# Patient Record
Sex: Female | Born: 1985 | Race: Black or African American | Hispanic: No | Marital: Married | Smoking: Never smoker
Health system: Southern US, Community
[De-identification: ages and names within clinical notes are randomized; demographics above are authoritative.]

---
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
## 2011-06-02 ENCOUNTER — Emergency Department: Payer: Self-pay | Admitting: Emergency Medicine

## 2011-06-02 LAB — PREGNANCY, URINE: Pregnancy Test, Urine: NEGATIVE m[IU]/mL

## 2011-06-03 ENCOUNTER — Emergency Department: Payer: Self-pay | Admitting: Internal Medicine

## 2015-04-03 ENCOUNTER — Encounter: Payer: Self-pay | Admitting: Physician Assistant

## 2015-04-03 ENCOUNTER — Ambulatory Visit: Payer: Self-pay | Admitting: Physician Assistant

## 2015-04-03 VITALS — BP 140/98 | HR 72 | Temp 98.6°F

## 2015-04-03 DIAGNOSIS — J020 Streptococcal pharyngitis: Secondary | ICD-10-CM

## 2015-04-03 LAB — POCT RAPID STREP A (OFFICE): Rapid Strep A Screen: POSITIVE — AB

## 2015-04-03 MED ORDER — AZITHROMYCIN 250 MG PO TABS: ORAL_TABLET | ORAL | Status: DC

## 2015-04-03 NOTE — Progress Notes (Signed)
S: c/o sore throat, no fever/chills, sx since Monday, very little congestion, no cough/v/d  O: vitals wnl, nad, tms clear, throat red with swollen tonsils, neck supple no lymph lungs c t a, cv rrr; q strep +  A: strep throat  P: zpack

## 2015-04-25 DIAGNOSIS — H5213 Myopia, bilateral: Secondary | ICD-10-CM | POA: Diagnosis not present

## 2015-06-07 DIAGNOSIS — Z0000 Encounter for general adult medical examination without abnormal findings: Secondary | ICD-10-CM | POA: Diagnosis not present

## 2015-06-07 DIAGNOSIS — Z13220 Encounter for screening for lipoid disorders: Secondary | ICD-10-CM | POA: Diagnosis not present

## 2015-10-23 ENCOUNTER — Ambulatory Visit: Payer: Self-pay | Admitting: Physician Assistant

## 2015-10-23 ENCOUNTER — Encounter: Payer: Self-pay | Admitting: Physician Assistant

## 2015-10-23 VITALS — BP 130/70 | HR 76 | Temp 98.1°F

## 2015-10-23 DIAGNOSIS — J029 Acute pharyngitis, unspecified: Secondary | ICD-10-CM

## 2015-10-23 DIAGNOSIS — Z3491 Encounter for supervision of normal pregnancy, unspecified, first trimester: Secondary | ICD-10-CM

## 2015-10-23 DIAGNOSIS — N912 Amenorrhea, unspecified: Secondary | ICD-10-CM

## 2015-10-23 DIAGNOSIS — J020 Streptococcal pharyngitis: Secondary | ICD-10-CM

## 2015-10-23 LAB — POCT URINE PREGNANCY: PREG TEST UR: POSITIVE — AB

## 2015-10-23 LAB — POCT RAPID STREP A (OFFICE): Rapid Strep A Screen: POSITIVE — AB

## 2015-10-23 MED ORDER — AZITHROMYCIN 250 MG PO TABS: ORAL_TABLET | ORAL | 0 refills | Status: AC

## 2015-10-23 NOTE — Progress Notes (Signed)
S: C/o runny nose and congestion for 3 days, throat is tickling,  no fever, chills, cp/sob, v/d;  c/o of facial and dental pain. Also thinks she's preg  Using otc meds:   O: PE: vitals wnl, nad, perrl eomi, normocephalic, tms dull, nasal mucosa red and swollen, throat injected, neck supple no lymph, lungs c t a, cv rrr, neuro intact q strep + Urine preg  A:  Strep throat, 1st trimester pregnancy   P: zpack, drink fluids, use saline nasal rinse, otc claritin,  return if not improving in 5 days, return earlier if worsening , f/u with gyn for prenatal care, take otc prenatal vit, no work today or tomorrow due to being contagious

## 2015-11-01 DIAGNOSIS — N926 Irregular menstruation, unspecified: Secondary | ICD-10-CM | POA: Diagnosis not present

## 2015-11-12 DIAGNOSIS — Z8249 Family history of ischemic heart disease and other diseases of the circulatory system: Secondary | ICD-10-CM | POA: Diagnosis not present

## 2015-11-12 DIAGNOSIS — Z8261 Family history of arthritis: Secondary | ICD-10-CM | POA: Diagnosis not present

## 2015-11-12 DIAGNOSIS — O34211 Maternal care for low transverse scar from previous cesarean delivery: Secondary | ICD-10-CM | POA: Diagnosis not present

## 2015-11-12 DIAGNOSIS — Z6841 Body Mass Index (BMI) 40.0 and over, adult: Secondary | ICD-10-CM | POA: Diagnosis not present

## 2015-11-12 DIAGNOSIS — Z3201 Encounter for pregnancy test, result positive: Secondary | ICD-10-CM | POA: Diagnosis not present

## 2015-11-12 DIAGNOSIS — Z3A13 13 weeks gestation of pregnancy: Secondary | ICD-10-CM | POA: Diagnosis not present

## 2015-11-12 DIAGNOSIS — O99211 Obesity complicating pregnancy, first trimester: Secondary | ICD-10-CM | POA: Diagnosis not present

## 2015-11-12 DIAGNOSIS — O34219 Maternal care for unspecified type scar from previous cesarean delivery: Secondary | ICD-10-CM | POA: Diagnosis not present

## 2015-11-12 DIAGNOSIS — O0991 Supervision of high risk pregnancy, unspecified, first trimester: Secondary | ICD-10-CM | POA: Diagnosis not present

## 2015-11-12 DIAGNOSIS — Z3491 Encounter for supervision of normal pregnancy, unspecified, first trimester: Secondary | ICD-10-CM | POA: Diagnosis not present

## 2015-11-12 DIAGNOSIS — Z8349 Family history of other endocrine, nutritional and metabolic diseases: Secondary | ICD-10-CM | POA: Diagnosis not present

## 2015-11-12 DIAGNOSIS — E669 Obesity, unspecified: Secondary | ICD-10-CM | POA: Diagnosis not present

## 2015-11-12 DIAGNOSIS — O09211 Supervision of pregnancy with history of pre-term labor, first trimester: Secondary | ICD-10-CM | POA: Diagnosis not present

## 2015-12-01 ENCOUNTER — Encounter: Payer: Self-pay | Admitting: Emergency Medicine

## 2015-12-01 ENCOUNTER — Emergency Department
Admission: EM | Admit: 2015-12-01 | Discharge: 2015-12-02 | Disposition: A | Discharge status: Home / Self Care | Payer: 59 | Attending: Emergency Medicine | Admitting: Emergency Medicine

## 2015-12-01 ENCOUNTER — Emergency Department: Payer: 59

## 2015-12-01 DIAGNOSIS — R1084 Generalized abdominal pain: Secondary | ICD-10-CM | POA: Diagnosis not present

## 2015-12-01 DIAGNOSIS — R110 Nausea: Secondary | ICD-10-CM | POA: Diagnosis not present

## 2015-12-01 DIAGNOSIS — O219 Vomiting of pregnancy, unspecified: Secondary | ICD-10-CM | POA: Insufficient documentation

## 2015-12-01 DIAGNOSIS — Z3A16 16 weeks gestation of pregnancy: Secondary | ICD-10-CM | POA: Diagnosis not present

## 2015-12-01 DIAGNOSIS — O210 Mild hyperemesis gravidarum: Secondary | ICD-10-CM | POA: Diagnosis not present

## 2015-12-01 DIAGNOSIS — R102 Pelvic and perineal pain: Secondary | ICD-10-CM | POA: Diagnosis not present

## 2015-12-01 DIAGNOSIS — O26892 Other specified pregnancy related conditions, second trimester: Secondary | ICD-10-CM | POA: Diagnosis not present

## 2015-12-01 DIAGNOSIS — R112 Nausea with vomiting, unspecified: Secondary | ICD-10-CM

## 2015-12-01 DIAGNOSIS — R109 Unspecified abdominal pain: Secondary | ICD-10-CM

## 2015-12-01 LAB — CBC WITH DIFFERENTIAL/PLATELET
BASOS PCT: 0 %
Basophils Absolute: 0 10*3/uL (ref 0–0.1)
Eosinophils Absolute: 0 10*3/uL (ref 0–0.7)
Eosinophils Relative: 0 %
HEMATOCRIT: 37.2 % (ref 35.0–47.0)
HEMOGLOBIN: 12.5 g/dL (ref 12.0–16.0)
LYMPHS PCT: 19 %
Lymphs Abs: 2 10*3/uL (ref 1.0–3.6)
MCH: 25.7 pg — ABNORMAL LOW (ref 26.0–34.0)
MCHC: 33.7 g/dL (ref 32.0–36.0)
MCV: 76.1 fL — AB (ref 80.0–100.0)
MONO ABS: 0.9 10*3/uL (ref 0.2–0.9)
MONOS PCT: 9 %
NEUTROS ABS: 7.4 10*3/uL — AB (ref 1.4–6.5)
NEUTROS PCT: 72 %
Platelets: 211 10*3/uL (ref 150–440)
RBC: 4.89 MIL/uL (ref 3.80–5.20)
RDW: 16.4 % — AB (ref 11.5–14.5)
WBC: 10.3 10*3/uL (ref 3.6–11.0)

## 2015-12-01 LAB — URINALYSIS COMPLETE WITH MICROSCOPIC (ARMC ONLY)
BILIRUBIN URINE: NEGATIVE
Glucose, UA: NEGATIVE mg/dL
HGB URINE DIPSTICK: NEGATIVE
KETONES UR: NEGATIVE mg/dL
Nitrite: NEGATIVE
PH: 6 (ref 5.0–8.0)
PROTEIN: 30 mg/dL — AB
Specific Gravity, Urine: 1.026 (ref 1.005–1.030)

## 2015-12-01 LAB — COMPREHENSIVE METABOLIC PANEL
ALT: 14 U/L (ref 14–54)
ANION GAP: 7 (ref 5–15)
AST: 16 U/L (ref 15–41)
Albumin: 3.6 g/dL (ref 3.5–5.0)
Alkaline Phosphatase: 48 U/L (ref 38–126)
BILIRUBIN TOTAL: 0.2 mg/dL — AB (ref 0.3–1.2)
BUN: 6 mg/dL (ref 6–20)
CHLORIDE: 103 mmol/L (ref 101–111)
CO2: 24 mmol/L (ref 22–32)
Calcium: 9 mg/dL (ref 8.9–10.3)
Creatinine, Ser: 0.54 mg/dL (ref 0.44–1.00)
GFR calc Af Amer: >60 mL/min (ref >60)
Glucose, Bld: 91 mg/dL (ref 65–99)
POTASSIUM: 3.4 mmol/L — AB (ref 3.5–5.1)
Sodium: 134 mmol/L — ABNORMAL LOW (ref 135–145)
TOTAL PROTEIN: 7.6 g/dL (ref 6.5–8.1)

## 2015-12-01 LAB — ABO/RH: ABO/RH(D): O POS

## 2015-12-01 LAB — HCG, QUANTITATIVE, PREGNANCY: hCG, Beta Chain, Quant, S: 50170 m[IU]/mL — ABNORMAL HIGH (ref <5)

## 2015-12-01 MED ORDER — SODIUM CHLORIDE 0.9 % IV BOLUS (SEPSIS)
1000.0000 mL | Freq: Once | INTRAVENOUS | Status: AC
  Administered 2015-12-02: 1000 mL via INTRAVENOUS

## 2015-12-01 MED ORDER — METOCLOPRAMIDE HCL 5 MG/ML IJ SOLN: 10.0000 mg | Freq: Once | INTRAMUSCULAR | Status: DC

## 2015-12-01 NOTE — ED Notes (Signed)
MD at bedside. 

## 2015-12-01 NOTE — ED Triage Notes (Signed)
Patient states that she is about [redacted] weeks pregnant. She states that she woke up this morning with generalized abdominal pain radiating to her back. Patient also reports nausea and vomiting today.

## 2015-12-01 NOTE — ED Provider Notes (Signed)
[facility] Emergency Department Provider Note   ____________________________________________   First MD Initiated Contact with Patient 12/01/15 2336     (approximate)  I have reviewed the triage vital signs and the nursing notes.   HISTORY  Chief Complaint Abdominal Pain; Back Pain; and Emesis During Pregnancy    HPI Meagan Sutton is a 30 y.o. female who comes into the hospital today with abdominal pain and back pain. She reports that she was having the pain all day. The patient is [redacted] weeks pregnant and she was concerned. She reports that she also has been unable to keep any food or drink down. She reports she couldn't even drink water. The patient reports that the pain is in her mid back. She did sleep on the couch and wasn't sure if that might have been the cause of her symptoms. The patient reports that she got to a point where she just couldn't tolerate the pain and the vomiting anymore. She reports that the pain was all over her abdomen but more her upper abdomen than in her lower abdomen. The patient reports it was under her breast and around her middle abdomen. The pain also radiated to her back. The patient has never had this before and didn't take anything for it. She reports that she thinks she may be dehydrated. The patient rates her pain a 6 out of 10 in intensity currently. She is a G4 P2 103. The patient reports the pain is dull and she has had some problems with constipation but her last bowel movement was today. The patient is here today for evaluation.   History reviewed. No pertinent past medical history.  There are no active problems to display for this patient.   Past Surgical History:  Procedure Laterality Date  . CESAREAN SECTION     times 2    Prior to Admission medications   Medication Sig Start Date End Date Taking? Authorizing Provider  azithromycin (ZITHROMAX) 250 MG tablet 2 pills today then 1 pill a day for 4 days 10/23/15    Faythe Ghee, PA-C  metoCLOPramide (REGLAN) 10 MG tablet Take 1 tablet (10 mg total) by mouth every 8 (eight) hours as needed. 12/02/15   Rebecka Apley, MD    Allergies Review of patient's allergies indicates no known allergies.  No family history on file.  Social History Social History  Substance Use Topics  . Smoking status: Never Smoker  . Smokeless tobacco: Never Used  . Alcohol use No    Review of Systems Constitutional: No fever/chills Eyes: No visual changes. ENT: No sore throat. Cardiovascular: Denies chest pain. Respiratory: Denies shortness of breath. Gastrointestinal:  abdominal pain, nausea, vomiting.  No diarrhea.  No constipation. Genitourinary: Negative for dysuria. Musculoskeletal:  back pain. Skin: Negative for rash. Neurological: Negative for headaches, focal weakness or numbness.  10-point ROS otherwise negative.  ____________________________________________   PHYSICAL EXAM:  VITAL SIGNS: ED Triage Vitals  Enc Vitals Group     BP 12/01/15 2114 112/83     Pulse Rate 12/01/15 2114 94     Resp 12/01/15 2114 18     Temp 12/01/15 2114 98.2 F (36.8 C)     Temp Source 12/01/15 2114 Oral     SpO2 12/01/15 2114 100 %     Weight 12/01/15 2115 264 lb (119.7 kg)     Height 12/01/15 2115 5\' 7"  (1.702 m)     Head Circumference --      Peak  Flow --      Pain Score 12/01/15 2115 7     Pain Loc --      Pain Edu? --      Excl. in GC? --     Constitutional: Alert and oriented. Well appearing and in Mild distress. Eyes: Conjunctivae are normal. PERRL. EOMI. Head: Atraumatic. Nose: No congestion/rhinnorhea. Mouth/Throat: Mucous membranes are moist.  Oropharynx non-erythematous. Cardiovascular: Normal rate, regular rhythm. Grossly normal heart sounds.  Good peripheral circulation. Respiratory: Normal respiratory effort.  No retractions. Lungs CTAB. Gastrointestinal: Soft with some RUQ tenderness to palpation. No distention. Positive bowel  sounds Musculoskeletal: No lower extremity tenderness nor edema.   Neurologic:  Normal speech and language. Skin:  Skin is warm, dry and intact.  Psychiatric: Mood and affect are normal. Speech and behavior are normal.  ____________________________________________   LABS (all labs ordered are listed, but only abnormal results are displayed)  Labs Reviewed  CBC WITH DIFFERENTIAL/PLATELET - Abnormal; Notable for the following:       Result Value   MCV 76.1 (*)    MCH 25.7 (*)    RDW 16.4 (*)    Neutro Abs 7.4 (*)    All other components within normal limits  HCG, QUANTITATIVE, PREGNANCY - Abnormal; Notable for the following:    hCG, Beta Chain, Quant, S 50,170 (*)    All other components within normal limits  URINALYSIS COMPLETEWITH MICROSCOPIC ([facility] ONLY) - Abnormal; Notable for the following:    Color, Urine YELLOW (*)    APPearance CLOUDY (*)    Protein, ur 30 (*)    Leukocytes, UA 3+ (*)    Bacteria, UA RARE (*)    Squamous Epithelial / LPF 6-30 (*)    All other components within normal limits  COMPREHENSIVE METABOLIC PANEL - Abnormal; Notable for the following:    Sodium 134 (*)    Potassium 3.4 (*)    Total Bilirubin 0.2 (*)    All other components within normal limits  LIPASE, BLOOD  ABO/RH   ____________________________________________  EKG  none ____________________________________________  RADIOLOGY  US OB US Abd RUQ ____________________________________________   PROCEDURES  Procedure(s) performed: None  Procedures  Critical Care performed: No  ____________________________________________   INITIAL IMPRESSION / ASSESSMENT AND PLAN / ED COURSE  Pertinent labs & imaging results that were available during my care of the patient were reviewed by me and considered in my medical decision making (see chart for details).  This is a 30 year old female who comes into the hospital today with abdominal pain and vomiting. The patient is [redacted] weeks  pregnant she reports that her pain is not in her lower abdomen but in her upper abdomen. This is the patient's fourth pregnancy and she is concerned about possible preterm labor. The patient did receive a pelvic ultrasound but as her pain is in her right upper quadrant I will send her for a right upper quadrant ultrasound. I will give the patient a dose of Reglan as well as liter of normal saline for her vomiting. The patient does not have any ketones in her urine odor she have an elevated white blood cell count. I will reassess the patient.  Clinical Course  Value Comment By Time  US Abdomen Limited RUQ Unremarkable right upper quadrant ultrasound Rebecka ApleyAllison P Wister Hoefle, MD 10/16 0224  US OB Limited Viable 16 week 2 day intrauterine gestation. No findings for the patient's pain.   Rebecka ApleyAllison P Rayhana Slider, MD 10/16 [phone]   After a liter of normal  saline the patient reports that she feels improved. The patient will be discharged home to follow-up with her OB/GYN. The patient has no further questions or concerns at this time.  ____________________________________________   FINAL CLINICAL IMPRESSION(S) / ED DIAGNOSES  Final diagnoses:  Abdominal pain  Generalized abdominal pain  Nausea and vomiting, intractability of vomiting not specified, unspecified vomiting type      NEW MEDICATIONS STARTED DURING THIS VISIT:  New Prescriptions   METOCLOPRAMIDE (REGLAN) 10 MG TABLET    Take 1 tablet (10 mg total) by mouth every 8 (eight) hours as needed.     Note:  This document was prepared using Dragon voice recognition software and may include unintentional dictation errors.    Rebecka Apley, MD 12/02/15 [phone]

## 2015-12-01 NOTE — ED Notes (Signed)
Patient transported to Ultrasound 

## 2015-12-02 ENCOUNTER — Emergency Department: Payer: 59

## 2015-12-02 DIAGNOSIS — R102 Pelvic and perineal pain: Secondary | ICD-10-CM | POA: Diagnosis not present

## 2015-12-02 DIAGNOSIS — O219 Vomiting of pregnancy, unspecified: Secondary | ICD-10-CM | POA: Diagnosis not present

## 2015-12-02 DIAGNOSIS — R1084 Generalized abdominal pain: Secondary | ICD-10-CM | POA: Diagnosis not present

## 2015-12-02 DIAGNOSIS — Z3A16 16 weeks gestation of pregnancy: Secondary | ICD-10-CM | POA: Diagnosis not present

## 2015-12-02 LAB — LIPASE, BLOOD: LIPASE: 18 U/L (ref 11–51)

## 2015-12-02 MED ORDER — METOCLOPRAMIDE HCL 10 MG PO TABS: 10.0000 mg | ORAL_TABLET | Freq: Three times a day (TID) | ORAL | 0 refills | Status: AC | PRN

## 2015-12-02 NOTE — ED Notes (Signed)
Patient transported from Ultrasound by ultrasound tech

## 2015-12-02 NOTE — ED Notes (Signed)
Patient transported to Ultrasound 

## 2015-12-03 DIAGNOSIS — O34211 Maternal care for low transverse scar from previous cesarean delivery: Secondary | ICD-10-CM | POA: Diagnosis not present

## 2015-12-03 DIAGNOSIS — O99212 Obesity complicating pregnancy, second trimester: Secondary | ICD-10-CM | POA: Diagnosis not present

## 2015-12-03 DIAGNOSIS — R1010 Upper abdominal pain, unspecified: Secondary | ICD-10-CM | POA: Diagnosis not present

## 2015-12-03 DIAGNOSIS — O26872 Cervical shortening, second trimester: Secondary | ICD-10-CM | POA: Diagnosis not present

## 2015-12-03 DIAGNOSIS — O09219 Supervision of pregnancy with history of pre-term labor, unspecified trimester: Secondary | ICD-10-CM | POA: Diagnosis not present

## 2015-12-03 DIAGNOSIS — E669 Obesity, unspecified: Secondary | ICD-10-CM | POA: Diagnosis not present

## 2015-12-03 DIAGNOSIS — O9989 Other specified diseases and conditions complicating pregnancy, childbirth and the puerperium: Secondary | ICD-10-CM | POA: Diagnosis not present

## 2015-12-03 DIAGNOSIS — Z01818 Encounter for other preprocedural examination: Secondary | ICD-10-CM | POA: Diagnosis not present

## 2015-12-03 DIAGNOSIS — O4442 Low lying placenta NOS or without hemorrhage, second trimester: Secondary | ICD-10-CM | POA: Diagnosis not present

## 2015-12-03 DIAGNOSIS — O09212 Supervision of pregnancy with history of pre-term labor, second trimester: Secondary | ICD-10-CM | POA: Diagnosis not present

## 2015-12-03 DIAGNOSIS — Z7982 Long term (current) use of aspirin: Secondary | ICD-10-CM | POA: Diagnosis not present

## 2015-12-03 DIAGNOSIS — Z3A16 16 weeks gestation of pregnancy: Secondary | ICD-10-CM | POA: Diagnosis not present

## 2015-12-03 DIAGNOSIS — Z8261 Family history of arthritis: Secondary | ICD-10-CM | POA: Diagnosis not present

## 2015-12-04 DIAGNOSIS — O364XX0 Maternal care for intrauterine death, not applicable or unspecified: Secondary | ICD-10-CM | POA: Diagnosis not present

## 2015-12-04 DIAGNOSIS — Z7982 Long term (current) use of aspirin: Secondary | ICD-10-CM | POA: Diagnosis not present

## 2015-12-04 DIAGNOSIS — O021 Missed abortion: Secondary | ICD-10-CM | POA: Diagnosis not present

## 2015-12-04 DIAGNOSIS — Z3A16 16 weeks gestation of pregnancy: Secondary | ICD-10-CM | POA: Diagnosis not present

## 2015-12-20 DIAGNOSIS — O021 Missed abortion: Secondary | ICD-10-CM | POA: Diagnosis not present

## 2016-04-07 DIAGNOSIS — Z006 Encounter for examination for normal comparison and control in clinical research program: Secondary | ICD-10-CM | POA: Diagnosis not present

## 2016-04-07 DIAGNOSIS — O34219 Maternal care for unspecified type scar from previous cesarean delivery: Secondary | ICD-10-CM | POA: Diagnosis not present

## 2016-04-07 DIAGNOSIS — O09891 Supervision of other high risk pregnancies, first trimester: Secondary | ICD-10-CM | POA: Diagnosis not present

## 2016-04-07 DIAGNOSIS — O3680X0 Pregnancy with inconclusive fetal viability, not applicable or unspecified: Secondary | ICD-10-CM | POA: Diagnosis not present

## 2016-04-07 DIAGNOSIS — E669 Obesity, unspecified: Secondary | ICD-10-CM | POA: Diagnosis not present

## 2016-04-07 DIAGNOSIS — O09219 Supervision of pregnancy with history of pre-term labor, unspecified trimester: Secondary | ICD-10-CM | POA: Diagnosis not present

## 2016-04-07 DIAGNOSIS — Z3490 Encounter for supervision of normal pregnancy, unspecified, unspecified trimester: Secondary | ICD-10-CM | POA: Diagnosis not present

## 2016-04-07 DIAGNOSIS — O99211 Obesity complicating pregnancy, first trimester: Secondary | ICD-10-CM | POA: Diagnosis not present

## 2016-04-07 DIAGNOSIS — Z3A09 9 weeks gestation of pregnancy: Secondary | ICD-10-CM | POA: Diagnosis not present

## 2016-04-07 DIAGNOSIS — O99210 Obesity complicating pregnancy, unspecified trimester: Secondary | ICD-10-CM | POA: Diagnosis not present

## 2016-04-07 DIAGNOSIS — O021 Missed abortion: Secondary | ICD-10-CM | POA: Diagnosis not present

## 2016-05-05 DIAGNOSIS — O09291 Supervision of pregnancy with other poor reproductive or obstetric history, first trimester: Secondary | ICD-10-CM | POA: Diagnosis not present

## 2016-05-05 DIAGNOSIS — O34211 Maternal care for low transverse scar from previous cesarean delivery: Secondary | ICD-10-CM | POA: Diagnosis not present

## 2016-05-05 DIAGNOSIS — Z3A13 13 weeks gestation of pregnancy: Secondary | ICD-10-CM | POA: Diagnosis not present

## 2016-05-05 DIAGNOSIS — O09891 Supervision of other high risk pregnancies, first trimester: Secondary | ICD-10-CM | POA: Diagnosis not present

## 2016-05-05 DIAGNOSIS — O09211 Supervision of pregnancy with history of pre-term labor, first trimester: Secondary | ICD-10-CM | POA: Diagnosis not present

## 2016-05-05 DIAGNOSIS — O98311 Other infections with a predominantly sexual mode of transmission complicating pregnancy, first trimester: Secondary | ICD-10-CM | POA: Diagnosis not present

## 2016-05-05 DIAGNOSIS — Z6841 Body Mass Index (BMI) 40.0 and over, adult: Secondary | ICD-10-CM | POA: Diagnosis not present

## 2016-05-05 DIAGNOSIS — O99211 Obesity complicating pregnancy, first trimester: Secondary | ICD-10-CM | POA: Diagnosis not present

## 2016-05-05 DIAGNOSIS — E669 Obesity, unspecified: Secondary | ICD-10-CM | POA: Diagnosis not present

## 2016-05-26 DIAGNOSIS — Z6841 Body Mass Index (BMI) 40.0 and over, adult: Secondary | ICD-10-CM | POA: Diagnosis not present

## 2016-05-26 DIAGNOSIS — O99212 Obesity complicating pregnancy, second trimester: Secondary | ICD-10-CM | POA: Diagnosis not present

## 2016-05-26 DIAGNOSIS — E669 Obesity, unspecified: Secondary | ICD-10-CM | POA: Diagnosis not present

## 2016-05-26 DIAGNOSIS — Z3A16 16 weeks gestation of pregnancy: Secondary | ICD-10-CM | POA: Diagnosis not present

## 2016-05-26 DIAGNOSIS — Z7982 Long term (current) use of aspirin: Secondary | ICD-10-CM | POA: Diagnosis not present

## 2016-05-26 DIAGNOSIS — O09219 Supervision of pregnancy with history of pre-term labor, unspecified trimester: Secondary | ICD-10-CM | POA: Diagnosis not present

## 2016-05-26 DIAGNOSIS — O34211 Maternal care for low transverse scar from previous cesarean delivery: Secondary | ICD-10-CM | POA: Diagnosis not present

## 2016-05-26 DIAGNOSIS — O09212 Supervision of pregnancy with history of pre-term labor, second trimester: Secondary | ICD-10-CM | POA: Diagnosis not present

## 2016-05-26 DIAGNOSIS — O98312 Other infections with a predominantly sexual mode of transmission complicating pregnancy, second trimester: Secondary | ICD-10-CM | POA: Diagnosis not present

## 2016-05-26 DIAGNOSIS — A599 Trichomoniasis, unspecified: Secondary | ICD-10-CM | POA: Diagnosis not present

## 2016-06-02 DIAGNOSIS — O09891 Supervision of other high risk pregnancies, first trimester: Secondary | ICD-10-CM | POA: Diagnosis not present

## 2016-06-09 DIAGNOSIS — O09892 Supervision of other high risk pregnancies, second trimester: Secondary | ICD-10-CM | POA: Diagnosis not present

## 2016-06-09 DIAGNOSIS — E669 Obesity, unspecified: Secondary | ICD-10-CM | POA: Diagnosis not present

## 2016-06-09 DIAGNOSIS — O4402 Placenta previa specified as without hemorrhage, second trimester: Secondary | ICD-10-CM | POA: Diagnosis not present

## 2016-06-09 DIAGNOSIS — O09212 Supervision of pregnancy with history of pre-term labor, second trimester: Secondary | ICD-10-CM | POA: Diagnosis not present

## 2016-06-09 DIAGNOSIS — Z6740 Type O blood, Rh positive: Secondary | ICD-10-CM | POA: Diagnosis not present

## 2016-06-09 DIAGNOSIS — O34211 Maternal care for low transverse scar from previous cesarean delivery: Secondary | ICD-10-CM | POA: Diagnosis not present

## 2016-06-09 DIAGNOSIS — O98312 Other infections with a predominantly sexual mode of transmission complicating pregnancy, second trimester: Secondary | ICD-10-CM | POA: Diagnosis not present

## 2016-06-09 DIAGNOSIS — O09219 Supervision of pregnancy with history of pre-term labor, unspecified trimester: Secondary | ICD-10-CM | POA: Diagnosis not present

## 2016-06-09 DIAGNOSIS — A599 Trichomoniasis, unspecified: Secondary | ICD-10-CM | POA: Diagnosis not present

## 2016-06-09 DIAGNOSIS — O99212 Obesity complicating pregnancy, second trimester: Secondary | ICD-10-CM | POA: Diagnosis not present

## 2016-06-16 DIAGNOSIS — O09891 Supervision of other high risk pregnancies, first trimester: Secondary | ICD-10-CM | POA: Diagnosis not present

## 2016-06-16 DIAGNOSIS — Z6841 Body Mass Index (BMI) 40.0 and over, adult: Secondary | ICD-10-CM | POA: Diagnosis not present

## 2016-06-16 DIAGNOSIS — Z3A19 19 weeks gestation of pregnancy: Secondary | ICD-10-CM | POA: Diagnosis not present

## 2016-06-16 DIAGNOSIS — O99212 Obesity complicating pregnancy, second trimester: Secondary | ICD-10-CM | POA: Diagnosis not present

## 2016-06-16 DIAGNOSIS — O09212 Supervision of pregnancy with history of pre-term labor, second trimester: Secondary | ICD-10-CM | POA: Diagnosis not present

## 2016-06-23 DIAGNOSIS — O09212 Supervision of pregnancy with history of pre-term labor, second trimester: Secondary | ICD-10-CM | POA: Diagnosis not present

## 2016-06-23 DIAGNOSIS — Z6841 Body Mass Index (BMI) 40.0 and over, adult: Secondary | ICD-10-CM | POA: Diagnosis not present

## 2016-06-23 DIAGNOSIS — E669 Obesity, unspecified: Secondary | ICD-10-CM | POA: Diagnosis not present

## 2016-06-23 DIAGNOSIS — O34211 Maternal care for low transverse scar from previous cesarean delivery: Secondary | ICD-10-CM | POA: Diagnosis not present

## 2016-06-23 DIAGNOSIS — O09219 Supervision of pregnancy with history of pre-term labor, unspecified trimester: Secondary | ICD-10-CM | POA: Diagnosis not present

## 2016-06-23 DIAGNOSIS — O09891 Supervision of other high risk pregnancies, first trimester: Secondary | ICD-10-CM | POA: Diagnosis not present

## 2016-06-23 DIAGNOSIS — Z7982 Long term (current) use of aspirin: Secondary | ICD-10-CM | POA: Diagnosis not present

## 2016-06-23 DIAGNOSIS — O09292 Supervision of pregnancy with other poor reproductive or obstetric history, second trimester: Secondary | ICD-10-CM | POA: Diagnosis not present

## 2016-06-23 DIAGNOSIS — O99212 Obesity complicating pregnancy, second trimester: Secondary | ICD-10-CM | POA: Diagnosis not present

## 2016-06-23 DIAGNOSIS — Z3A20 20 weeks gestation of pregnancy: Secondary | ICD-10-CM | POA: Diagnosis not present

## 2016-07-07 DIAGNOSIS — A5901 Trichomonal vulvovaginitis: Secondary | ICD-10-CM | POA: Diagnosis not present

## 2016-07-07 DIAGNOSIS — Z6841 Body Mass Index (BMI) 40.0 and over, adult: Secondary | ICD-10-CM | POA: Diagnosis not present

## 2016-07-07 DIAGNOSIS — E669 Obesity, unspecified: Secondary | ICD-10-CM | POA: Diagnosis not present

## 2016-07-07 DIAGNOSIS — O99212 Obesity complicating pregnancy, second trimester: Secondary | ICD-10-CM | POA: Diagnosis not present

## 2016-07-07 DIAGNOSIS — O09219 Supervision of pregnancy with history of pre-term labor, unspecified trimester: Secondary | ICD-10-CM | POA: Diagnosis not present

## 2016-07-07 DIAGNOSIS — O34219 Maternal care for unspecified type scar from previous cesarean delivery: Secondary | ICD-10-CM | POA: Diagnosis not present

## 2016-07-07 DIAGNOSIS — O98312 Other infections with a predominantly sexual mode of transmission complicating pregnancy, second trimester: Secondary | ICD-10-CM | POA: Diagnosis not present

## 2016-07-07 DIAGNOSIS — O09212 Supervision of pregnancy with history of pre-term labor, second trimester: Secondary | ICD-10-CM | POA: Diagnosis not present

## 2016-07-07 DIAGNOSIS — O99210 Obesity complicating pregnancy, unspecified trimester: Secondary | ICD-10-CM | POA: Diagnosis not present

## 2016-07-07 DIAGNOSIS — Z3A22 22 weeks gestation of pregnancy: Secondary | ICD-10-CM | POA: Diagnosis not present

## 2016-07-07 DIAGNOSIS — O09292 Supervision of pregnancy with other poor reproductive or obstetric history, second trimester: Secondary | ICD-10-CM | POA: Diagnosis not present

## 2016-07-14 DIAGNOSIS — Z6841 Body Mass Index (BMI) 40.0 and over, adult: Secondary | ICD-10-CM | POA: Diagnosis not present

## 2016-07-14 DIAGNOSIS — O99210 Obesity complicating pregnancy, unspecified trimester: Secondary | ICD-10-CM | POA: Diagnosis not present

## 2016-07-14 DIAGNOSIS — O99212 Obesity complicating pregnancy, second trimester: Secondary | ICD-10-CM | POA: Diagnosis not present

## 2016-07-14 DIAGNOSIS — Z8751 Personal history of pre-term labor: Secondary | ICD-10-CM | POA: Diagnosis not present

## 2016-07-14 DIAGNOSIS — Z3A23 23 weeks gestation of pregnancy: Secondary | ICD-10-CM | POA: Diagnosis not present

## 2016-07-28 DIAGNOSIS — E669 Obesity, unspecified: Secondary | ICD-10-CM | POA: Diagnosis not present

## 2016-07-28 DIAGNOSIS — O09212 Supervision of pregnancy with history of pre-term labor, second trimester: Secondary | ICD-10-CM | POA: Diagnosis not present

## 2016-07-28 DIAGNOSIS — O34211 Maternal care for low transverse scar from previous cesarean delivery: Secondary | ICD-10-CM | POA: Diagnosis not present

## 2016-07-28 DIAGNOSIS — O09292 Supervision of pregnancy with other poor reproductive or obstetric history, second trimester: Secondary | ICD-10-CM | POA: Diagnosis not present

## 2016-07-28 DIAGNOSIS — Z3A25 25 weeks gestation of pregnancy: Secondary | ICD-10-CM | POA: Diagnosis not present

## 2016-07-28 DIAGNOSIS — O09892 Supervision of other high risk pregnancies, second trimester: Secondary | ICD-10-CM | POA: Diagnosis not present

## 2016-07-28 DIAGNOSIS — Z7982 Long term (current) use of aspirin: Secondary | ICD-10-CM | POA: Diagnosis not present

## 2016-07-28 DIAGNOSIS — O99212 Obesity complicating pregnancy, second trimester: Secondary | ICD-10-CM | POA: Diagnosis not present

## 2016-07-28 DIAGNOSIS — Z6841 Body Mass Index (BMI) 40.0 and over, adult: Secondary | ICD-10-CM | POA: Diagnosis not present

## 2016-08-11 DIAGNOSIS — O09219 Supervision of pregnancy with history of pre-term labor, unspecified trimester: Secondary | ICD-10-CM | POA: Diagnosis not present

## 2016-08-11 DIAGNOSIS — Z006 Encounter for examination for normal comparison and control in clinical research program: Secondary | ICD-10-CM | POA: Diagnosis not present

## 2016-08-11 DIAGNOSIS — O99210 Obesity complicating pregnancy, unspecified trimester: Secondary | ICD-10-CM | POA: Diagnosis not present

## 2016-08-16 DIAGNOSIS — O368120 Decreased fetal movements, second trimester, not applicable or unspecified: Secondary | ICD-10-CM | POA: Diagnosis not present

## 2016-08-16 DIAGNOSIS — E669 Obesity, unspecified: Secondary | ICD-10-CM | POA: Diagnosis not present

## 2016-08-16 DIAGNOSIS — O99212 Obesity complicating pregnancy, second trimester: Secondary | ICD-10-CM | POA: Diagnosis not present

## 2016-08-16 DIAGNOSIS — Z8742 Personal history of other diseases of the female genital tract: Secondary | ICD-10-CM | POA: Diagnosis not present

## 2016-08-16 DIAGNOSIS — O09892 Supervision of other high risk pregnancies, second trimester: Secondary | ICD-10-CM | POA: Diagnosis not present

## 2016-08-16 DIAGNOSIS — O34211 Maternal care for low transverse scar from previous cesarean delivery: Secondary | ICD-10-CM | POA: Diagnosis not present

## 2016-08-16 DIAGNOSIS — O09212 Supervision of pregnancy with history of pre-term labor, second trimester: Secondary | ICD-10-CM | POA: Diagnosis not present

## 2016-08-16 DIAGNOSIS — Z8619 Personal history of other infectious and parasitic diseases: Secondary | ICD-10-CM | POA: Diagnosis not present

## 2016-08-16 DIAGNOSIS — Z3A27 27 weeks gestation of pregnancy: Secondary | ICD-10-CM | POA: Diagnosis not present

## 2016-08-18 DIAGNOSIS — Z7982 Long term (current) use of aspirin: Secondary | ICD-10-CM | POA: Diagnosis not present

## 2016-08-18 DIAGNOSIS — E669 Obesity, unspecified: Secondary | ICD-10-CM | POA: Diagnosis not present

## 2016-08-18 DIAGNOSIS — O09213 Supervision of pregnancy with history of pre-term labor, third trimester: Secondary | ICD-10-CM | POA: Diagnosis not present

## 2016-08-18 DIAGNOSIS — Z3A28 28 weeks gestation of pregnancy: Secondary | ICD-10-CM | POA: Diagnosis not present

## 2016-08-18 DIAGNOSIS — O34211 Maternal care for low transverse scar from previous cesarean delivery: Secondary | ICD-10-CM | POA: Diagnosis not present

## 2016-08-18 DIAGNOSIS — O99213 Obesity complicating pregnancy, third trimester: Secondary | ICD-10-CM | POA: Diagnosis not present

## 2016-08-18 DIAGNOSIS — Z6841 Body Mass Index (BMI) 40.0 and over, adult: Secondary | ICD-10-CM | POA: Diagnosis not present

## 2016-09-08 DIAGNOSIS — O99213 Obesity complicating pregnancy, third trimester: Secondary | ICD-10-CM | POA: Diagnosis not present

## 2016-09-08 DIAGNOSIS — Z3A31 31 weeks gestation of pregnancy: Secondary | ICD-10-CM | POA: Diagnosis not present

## 2016-09-08 DIAGNOSIS — E669 Obesity, unspecified: Secondary | ICD-10-CM | POA: Diagnosis not present

## 2016-09-08 DIAGNOSIS — O34219 Maternal care for unspecified type scar from previous cesarean delivery: Secondary | ICD-10-CM | POA: Diagnosis not present

## 2016-09-08 DIAGNOSIS — O09213 Supervision of pregnancy with history of pre-term labor, third trimester: Secondary | ICD-10-CM | POA: Diagnosis not present

## 2016-09-29 DIAGNOSIS — O99213 Obesity complicating pregnancy, third trimester: Secondary | ICD-10-CM | POA: Diagnosis not present

## 2016-09-29 DIAGNOSIS — O09891 Supervision of other high risk pregnancies, first trimester: Secondary | ICD-10-CM | POA: Diagnosis not present

## 2016-09-29 DIAGNOSIS — E669 Obesity, unspecified: Secondary | ICD-10-CM | POA: Diagnosis not present

## 2016-09-29 DIAGNOSIS — O09893 Supervision of other high risk pregnancies, third trimester: Secondary | ICD-10-CM | POA: Diagnosis not present

## 2016-09-29 DIAGNOSIS — Z3A34 34 weeks gestation of pregnancy: Secondary | ICD-10-CM | POA: Diagnosis not present

## 2016-09-29 DIAGNOSIS — O99210 Obesity complicating pregnancy, unspecified trimester: Secondary | ICD-10-CM | POA: Diagnosis not present

## 2016-10-06 DIAGNOSIS — O99213 Obesity complicating pregnancy, third trimester: Secondary | ICD-10-CM | POA: Diagnosis not present

## 2016-10-06 DIAGNOSIS — O34211 Maternal care for low transverse scar from previous cesarean delivery: Secondary | ICD-10-CM | POA: Diagnosis not present

## 2016-10-06 DIAGNOSIS — O09213 Supervision of pregnancy with history of pre-term labor, third trimester: Secondary | ICD-10-CM | POA: Diagnosis not present

## 2016-10-06 DIAGNOSIS — O34219 Maternal care for unspecified type scar from previous cesarean delivery: Secondary | ICD-10-CM | POA: Diagnosis not present

## 2016-10-06 DIAGNOSIS — E669 Obesity, unspecified: Secondary | ICD-10-CM | POA: Diagnosis not present

## 2016-10-06 DIAGNOSIS — Z3A35 35 weeks gestation of pregnancy: Secondary | ICD-10-CM | POA: Diagnosis not present

## 2016-10-13 DIAGNOSIS — O09213 Supervision of pregnancy with history of pre-term labor, third trimester: Secondary | ICD-10-CM | POA: Diagnosis not present

## 2016-10-13 DIAGNOSIS — E669 Obesity, unspecified: Secondary | ICD-10-CM | POA: Diagnosis not present

## 2016-10-13 DIAGNOSIS — O34219 Maternal care for unspecified type scar from previous cesarean delivery: Secondary | ICD-10-CM | POA: Diagnosis not present

## 2016-10-13 DIAGNOSIS — O99213 Obesity complicating pregnancy, third trimester: Secondary | ICD-10-CM | POA: Diagnosis not present

## 2016-10-13 DIAGNOSIS — Z3A36 36 weeks gestation of pregnancy: Secondary | ICD-10-CM | POA: Diagnosis not present

## 2016-10-20 DIAGNOSIS — Z3A37 37 weeks gestation of pregnancy: Secondary | ICD-10-CM | POA: Diagnosis not present

## 2016-10-20 DIAGNOSIS — O34219 Maternal care for unspecified type scar from previous cesarean delivery: Secondary | ICD-10-CM | POA: Diagnosis not present

## 2016-10-20 DIAGNOSIS — E669 Obesity, unspecified: Secondary | ICD-10-CM | POA: Diagnosis not present

## 2016-10-20 DIAGNOSIS — O09213 Supervision of pregnancy with history of pre-term labor, third trimester: Secondary | ICD-10-CM | POA: Diagnosis not present

## 2016-10-20 DIAGNOSIS — O99213 Obesity complicating pregnancy, third trimester: Secondary | ICD-10-CM | POA: Diagnosis not present

## 2016-10-27 DIAGNOSIS — O99213 Obesity complicating pregnancy, third trimester: Secondary | ICD-10-CM | POA: Diagnosis not present

## 2016-10-27 DIAGNOSIS — Z3A38 38 weeks gestation of pregnancy: Secondary | ICD-10-CM | POA: Diagnosis not present

## 2016-10-27 DIAGNOSIS — O34219 Maternal care for unspecified type scar from previous cesarean delivery: Secondary | ICD-10-CM | POA: Diagnosis not present

## 2016-10-27 DIAGNOSIS — O09213 Supervision of pregnancy with history of pre-term labor, third trimester: Secondary | ICD-10-CM | POA: Diagnosis not present

## 2016-10-27 DIAGNOSIS — E669 Obesity, unspecified: Secondary | ICD-10-CM | POA: Diagnosis not present

## 2016-10-31 DIAGNOSIS — O09219 Supervision of pregnancy with history of pre-term labor, unspecified trimester: Secondary | ICD-10-CM | POA: Diagnosis not present

## 2016-11-02 DIAGNOSIS — O9081 Anemia of the puerperium: Secondary | ICD-10-CM | POA: Diagnosis not present

## 2016-11-02 DIAGNOSIS — O34211 Maternal care for low transverse scar from previous cesarean delivery: Secondary | ICD-10-CM | POA: Diagnosis not present

## 2016-11-02 DIAGNOSIS — O34219 Maternal care for unspecified type scar from previous cesarean delivery: Secondary | ICD-10-CM | POA: Diagnosis not present

## 2016-11-02 DIAGNOSIS — D62 Acute posthemorrhagic anemia: Secondary | ICD-10-CM | POA: Diagnosis not present

## 2016-11-02 DIAGNOSIS — Z6841 Body Mass Index (BMI) 40.0 and over, adult: Secondary | ICD-10-CM | POA: Diagnosis not present

## 2016-11-02 DIAGNOSIS — Z3A39 39 weeks gestation of pregnancy: Secondary | ICD-10-CM | POA: Diagnosis not present

## 2016-11-02 DIAGNOSIS — E669 Obesity, unspecified: Secondary | ICD-10-CM | POA: Diagnosis not present

## 2016-11-02 DIAGNOSIS — O99214 Obesity complicating childbirth: Secondary | ICD-10-CM | POA: Diagnosis not present

## 2016-12-01 DIAGNOSIS — N883 Incompetence of cervix uteri: Secondary | ICD-10-CM | POA: Diagnosis not present

## 2016-12-01 DIAGNOSIS — Z23 Encounter for immunization: Secondary | ICD-10-CM | POA: Diagnosis not present

## 2017-05-19 ENCOUNTER — Encounter: Payer: Self-pay | Admitting: Emergency Medicine

## 2017-05-19 ENCOUNTER — Emergency Department
Admission: EM | Admit: 2017-05-19 | Discharge: 2017-05-19 | Disposition: A | Discharge status: Home / Self Care | Payer: 59 | Attending: Emergency Medicine | Admitting: Emergency Medicine

## 2017-05-19 ENCOUNTER — Other Ambulatory Visit: Payer: Self-pay

## 2017-05-19 ENCOUNTER — Emergency Department: Payer: 59

## 2017-05-19 DIAGNOSIS — R0602 Shortness of breath: Secondary | ICD-10-CM | POA: Diagnosis not present

## 2017-05-19 DIAGNOSIS — F419 Anxiety disorder, unspecified: Secondary | ICD-10-CM | POA: Diagnosis not present

## 2017-05-19 DIAGNOSIS — R51 Headache: Secondary | ICD-10-CM | POA: Diagnosis not present

## 2017-05-19 DIAGNOSIS — R079 Chest pain, unspecified: Secondary | ICD-10-CM | POA: Diagnosis not present

## 2017-05-19 DIAGNOSIS — F410 Panic disorder [episodic paroxysmal anxiety] without agoraphobia: Secondary | ICD-10-CM | POA: Insufficient documentation

## 2017-05-19 LAB — FIBRIN DERIVATIVES D-DIMER (ARMC ONLY): FIBRIN DERIVATIVES D-DIMER (ARMC): 897.71 ng{FEU}/mL — AB (ref 0.00–499.00)

## 2017-05-19 LAB — CBC WITH DIFFERENTIAL/PLATELET
BASOS ABS: 0 10*3/uL (ref 0–0.1)
BASOS PCT: 0 %
EOS PCT: 0 %
Eosinophils Absolute: 0 10*3/uL (ref 0–0.7)
HEMATOCRIT: 34.4 % — AB (ref 35.0–47.0)
Hemoglobin: 11 g/dL — ABNORMAL LOW (ref 12.0–16.0)
LYMPHS PCT: 16 %
Lymphs Abs: 1.1 10*3/uL (ref 1.0–3.6)
MCH: 23.1 pg — ABNORMAL LOW (ref 26.0–34.0)
MCHC: 31.9 g/dL — ABNORMAL LOW (ref 32.0–36.0)
MCV: 72.2 fL — AB (ref 80.0–100.0)
Monocytes Absolute: 0.7 10*3/uL (ref 0.2–0.9)
Monocytes Relative: 11 %
NEUTROS ABS: 4.6 10*3/uL (ref 1.4–6.5)
Neutrophils Relative %: 71 %
PLATELETS: 188 10*3/uL (ref 150–440)
RBC: 4.76 MIL/uL (ref 3.80–5.20)
RDW: 17 % — ABNORMAL HIGH (ref 11.5–14.5)
WBC: 6.5 10*3/uL (ref 3.6–11.0)

## 2017-05-19 LAB — BASIC METABOLIC PANEL
ANION GAP: 8 (ref 5–15)
BUN: 8 mg/dL (ref 6–20)
CALCIUM: 8.8 mg/dL — AB (ref 8.9–10.3)
CO2: 22 mmol/L (ref 22–32)
Chloride: 107 mmol/L (ref 101–111)
Creatinine, Ser: 0.84 mg/dL (ref 0.44–1.00)
GLUCOSE: 100 mg/dL — AB (ref 65–99)
POTASSIUM: 3.9 mmol/L (ref 3.5–5.1)
Sodium: 137 mmol/L (ref 135–145)

## 2017-05-19 LAB — POCT PREGNANCY, URINE
PREG TEST UR: NEGATIVE
Preg Test, Ur: NEGATIVE

## 2017-05-19 LAB — TROPONIN I: Troponin I: <0.03 ng/mL (ref <0.03)

## 2017-05-19 MED ORDER — ACETAMINOPHEN 500 MG PO TABS
1000.0000 mg | ORAL_TABLET | Freq: Once | ORAL | Status: AC
  Administered 2017-05-19: 1000 mg via ORAL

## 2017-05-19 MED ORDER — ACETAMINOPHEN 500 MG PO TABS
ORAL_TABLET | ORAL | Status: AC
  Administered 2017-05-19: 1000 mg via ORAL
  Filled 2017-05-19: qty 2

## 2017-05-19 MED ORDER — FERROUS SULFATE DRIED ER 160 (50 FE) MG PO TBCR
160.0000 mg | EXTENDED_RELEASE_TABLET | ORAL | 6 refills | Status: AC
Start: 2017-05-19 — End: ?

## 2017-05-19 MED ORDER — OXYCODONE-ACETAMINOPHEN 5-325 MG PO TABS
2.0000 | ORAL_TABLET | Freq: Once | ORAL | Status: DC
  Filled 2017-05-19: qty 2

## 2017-05-19 MED ORDER — IOPAMIDOL (ISOVUE-370) INJECTION 76%
100.0000 mL | Freq: Once | INTRAVENOUS | Status: AC | PRN
  Administered 2017-05-19: 100 mL via INTRAVENOUS

## 2017-05-19 MED ORDER — IOHEXOL 350 MG/ML SOLN
75.0000 mL | Freq: Once | INTRAVENOUS | Status: AC | PRN
  Administered 2017-05-19: 75 mL via INTRAVENOUS

## 2017-05-19 MED ORDER — DIAZEPAM 5 MG PO TABS
10.0000 mg | ORAL_TABLET | Freq: Once | ORAL | Status: AC
  Administered 2017-05-19: 10 mg via ORAL
  Filled 2017-05-19: qty 2

## 2017-05-19 MED ORDER — DIAZEPAM 5 MG PO TABS: 5.0000 mg | ORAL_TABLET | Freq: Three times a day (TID) | ORAL | 0 refills | Status: AC | PRN

## 2017-05-19 NOTE — ED Provider Notes (Signed)
[facility] Emergency Department Provider Note       Time seen: ----------------------------------------- 7:46 AM on 05/19/2017 -----------------------------------------   I have reviewed the triage vital signs and the nursing notes.  HISTORY   Chief Complaint Panic Attack and Chest Pain    HPI Meagan Sutton is a 32 y.o. female with no significant past medical history who presents to the ED for intermittent episodes of headache with dizziness since Sunday.  Patient states today chest started to hurt and she presents anxious and hyperventilating on arrival to the ER.  She is not having shortness of breath but she again is hyperventilating in triage.  Patient states she has had 4 episodes like this and she is not sure if they are a panic attack.  She was also concerned it may be a heart attack.  Nothing makes it better.  History reviewed. No pertinent past medical history.  There are no active problems to display for this patient.   Past Surgical History:  Procedure Laterality Date  . CESAREAN SECTION     times 2    Allergies Patient has no known allergies.  Social History Social History   Tobacco Use  . Smoking status: Never Smoker  . Smokeless tobacco: Never Used  Substance Use Topics  . Alcohol use: No    Alcohol/week: 0.0 oz  . Drug use: No   Review of Systems Constitutional: Negative for fever. Cardiovascular: Positive for chest pain Respiratory: Negative for shortness of breath. Gastrointestinal: Negative for abdominal pain, vomiting and diarrhea. Musculoskeletal: Negative for back pain. Skin: Negative for rash. Neurological: Positive for headache  All systems negative/normal/unremarkable except as stated in the HPI  ____________________________________________   PHYSICAL EXAM:  VITAL SIGNS: ED Triage Vitals  Enc Vitals Group     BP 05/19/17 0740 137/82     Pulse Rate 05/19/17 0740 (!) 126     Resp 05/19/17 0740 (!) 38      Temp 05/19/17 0740 99.5 F (37.5 C)     Temp Source 05/19/17 0740 Oral     SpO2 05/19/17 0740 100 %     Weight 05/19/17 0741 269 lb (122 kg)     Height 05/19/17 0741 5' 7.5" (1.715 m)     Head Circumference --      Peak Flow --      Pain Score 05/19/17 0741 6     Pain Loc --      Pain Edu? --      Excl. in GC? --    Constitutional: Alert and oriented.  Anxious Eyes: Conjunctivae are normal. Normal extraocular movements. ENT   Head: Normocephalic and atraumatic.   Nose: No congestion/rhinnorhea.   Mouth/Throat: Mucous membranes are moist.   Neck: No stridor. Cardiovascular: Normal rate, regular rhythm. No murmurs, rubs, or gallops. Respiratory: Tachypnea with clear breath sounds Gastrointestinal: Soft and nontender. Normal bowel sounds Musculoskeletal: Nontender with normal range of motion in extremities. No lower extremity tenderness nor edema. Neurologic:  Normal speech and language. No gross focal neurologic deficits are appreciated.  Skin:  Skin is warm, dry and intact. No rash noted. Psychiatric: Anxious mood and affect ____________________________________________  EKG: Interpreted by me.  Sinus tachycardia with a rate of 112 bpm, normal PR interval, normal QRS, normal QT.  ____________________________________________  ED COURSE:  As part of my medical decision making, I reviewed the following data within the electronic MEDICAL RECORD NUMBER History obtained from family if available, nursing notes, old chart and ekg, as well  as notes from prior ED visits. Patient presented for headache and apparent anxiety, we will assess with labs and imaging as indicated at this time.   Procedures ____________________________________________   LABS (pertinent positives/negatives)  Labs Reviewed  CBC WITH DIFFERENTIAL/PLATELET - Abnormal; Notable for the following components:      Result Value   Hemoglobin 11.0 (*)    HCT 34.4 (*)    MCV 72.2 (*)    MCH 23.1 (*)     MCHC 31.9 (*)    RDW 17.0 (*)    All other components within normal limits  BASIC METABOLIC PANEL - Abnormal; Notable for the following components:   Glucose, Bld 100 (*)    Calcium 8.8 (*)    All other components within normal limits  FIBRIN DERIVATIVES D-DIMER ([facility] ONLY) - Abnormal; Notable for the following components:   Fibrin derivatives D-dimer ([facility]) 897.71 (*)    All other components within normal limits  TROPONIN I  POC URINE PREG, ED  POCT PREGNANCY, URINE    RADIOLOGY Images were viewed by me  Chest x-ray IMPRESSION: No edema or consolidation. CT angiogram of the chest is negative ____________________________________________  DIFFERENTIAL DIAGNOSIS   Panic attack, anxiety, PE, musculoskeletal pain, tension headache, migraine  FINAL ASSESSMENT AND PLAN  Panic attack   Plan: The patient had presented for symptoms of anxiety with chest pain and headache. Patient's labs did reveal a positive d-dimer which led to further testing. Patient's imaging including CT angiogram was negative.  This is likely anxiety, she will be given a prescription for Valium and is stable for outpatient follow-up.   Ulice Dash, MD   Note: This note was generated in part or whole with voice recognition software. Voice recognition is usually quite accurate but there are transcription errors that can and very often do occur. I apologize for any typographical errors that were not detected and corrected.     Emily Filbert, MD 05/19/17 1215

## 2017-05-19 NOTE — ED Triage Notes (Signed)
C/O intermittent episodes of headache, migrain, dizziness since Sunday.  States today chest started to hurt.  Patient is anxious and tearful on arrival.  Hyperventilating.

## 2017-05-19 NOTE — ED Notes (Signed)
Pt discharged to home.  Family member driving.  Discharge instructions reviewed.  Verbalized understanding.  No questions or concerns at this time.  Teach back verified.  Pt in NAD.  No items left in ED.   

## 2017-07-13 DIAGNOSIS — Z6841 Body Mass Index (BMI) 40.0 and over, adult: Secondary | ICD-10-CM | POA: Diagnosis not present

## 2017-07-13 DIAGNOSIS — Z0000 Encounter for general adult medical examination without abnormal findings: Secondary | ICD-10-CM | POA: Diagnosis not present

## 2017-10-22 DIAGNOSIS — H5213 Myopia, bilateral: Secondary | ICD-10-CM | POA: Diagnosis not present

## 2018-03-30 IMAGING — US US OB LIMITED
1 series · 14 of 28 positions shown · non-contrast
Comparison: none

CLINICAL DATA: Abdominal pain, radiating to the back since this
morning. Nausea and vomiting.

EXAM:
LIMITED OBSTETRIC ULTRASOUND

[Series 1: us ob limited · 0.25mm/px · 38 acquisitions, 14 frames shown]
[im 2/38]
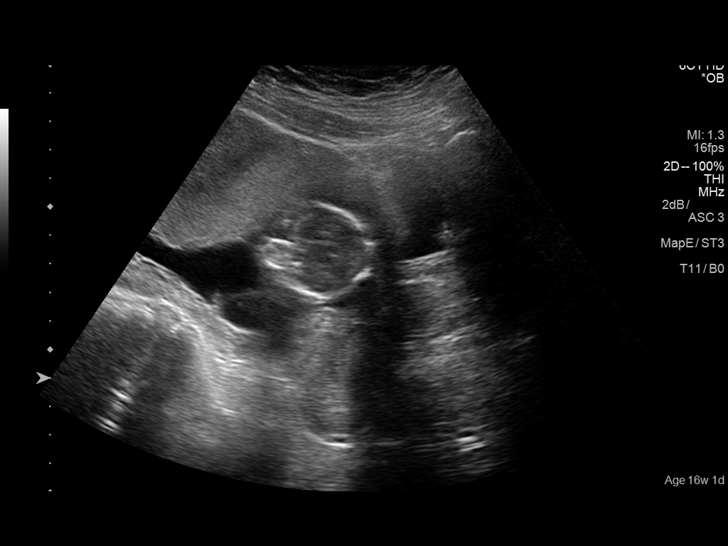
[im 5/38]
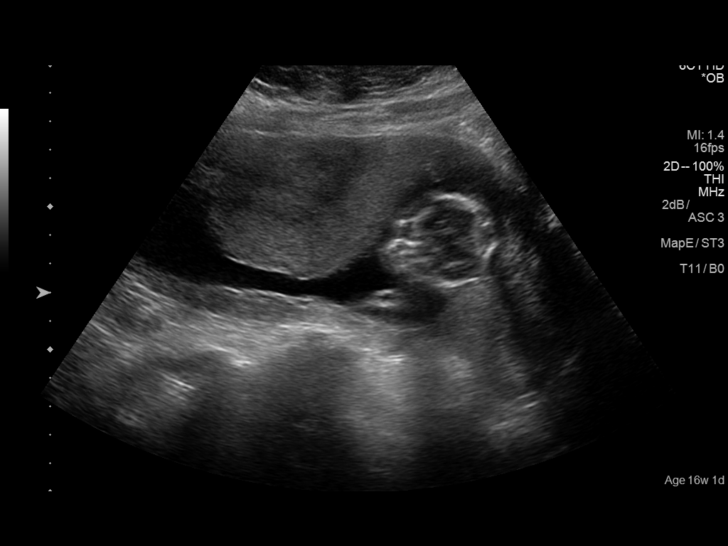
[im 7/38]
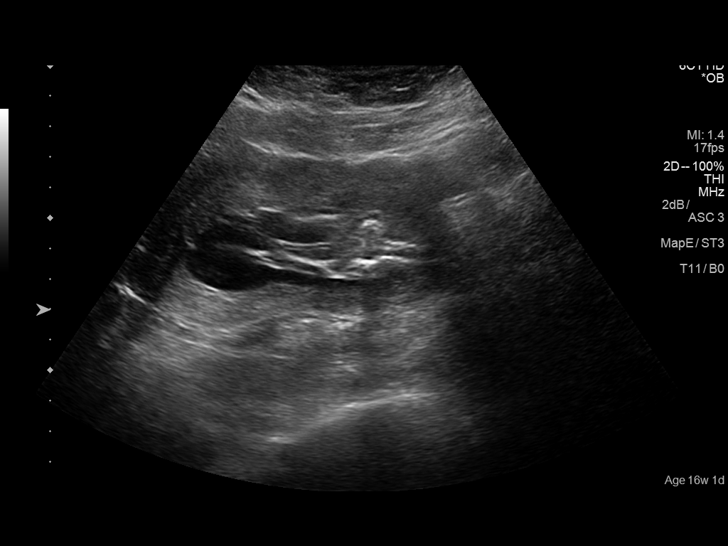
[im 10/38]
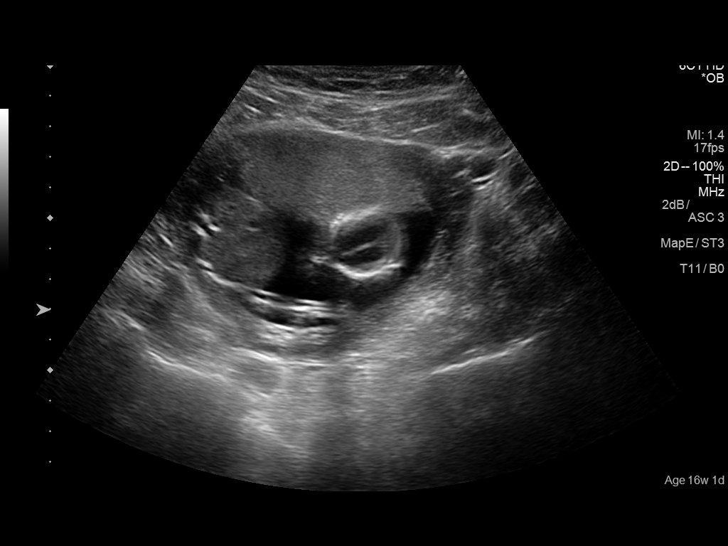
[im 13/38]
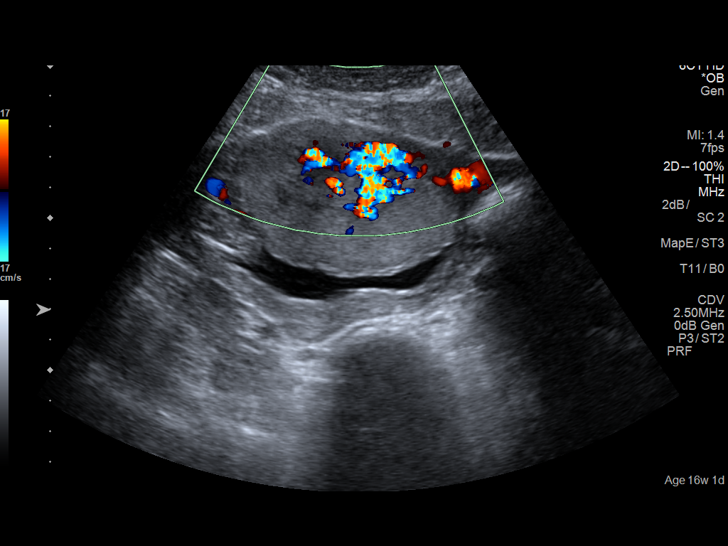
[im 16/38]
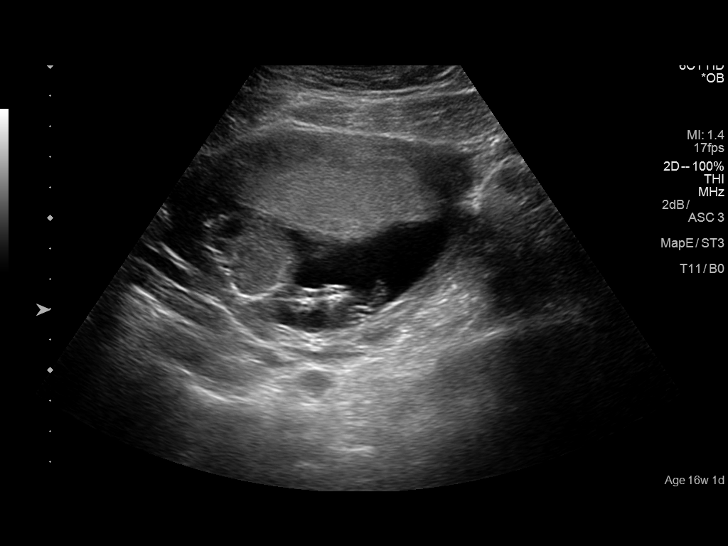
[im 18/38]
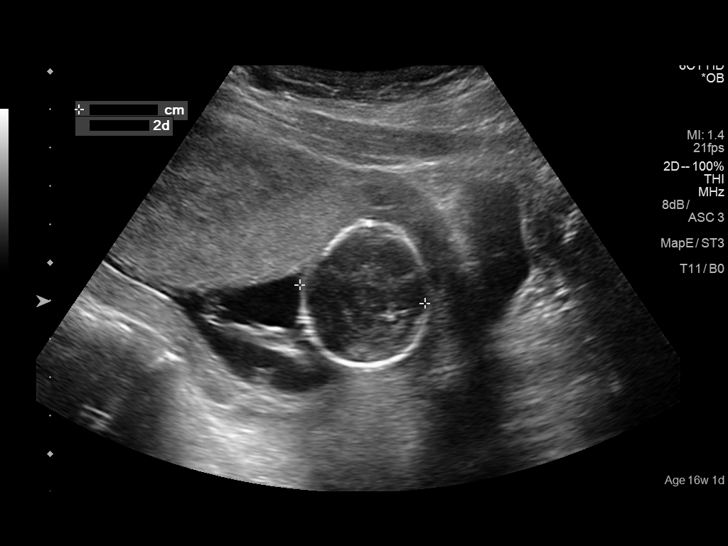
[im 21/38]
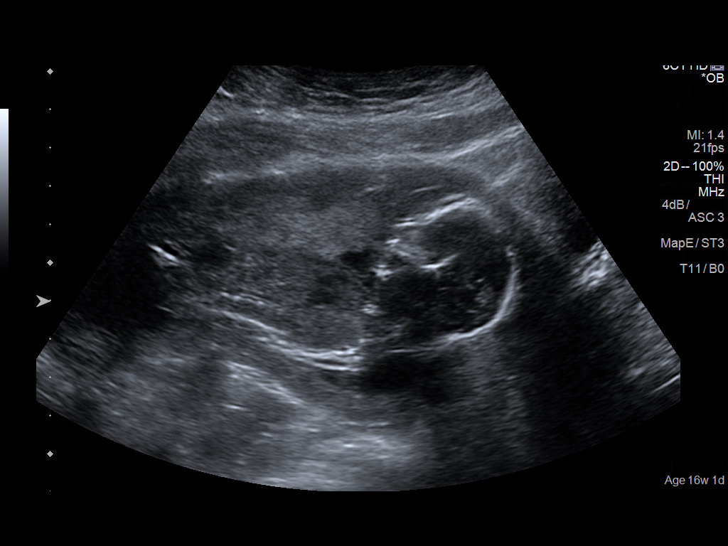
[im 24/38]
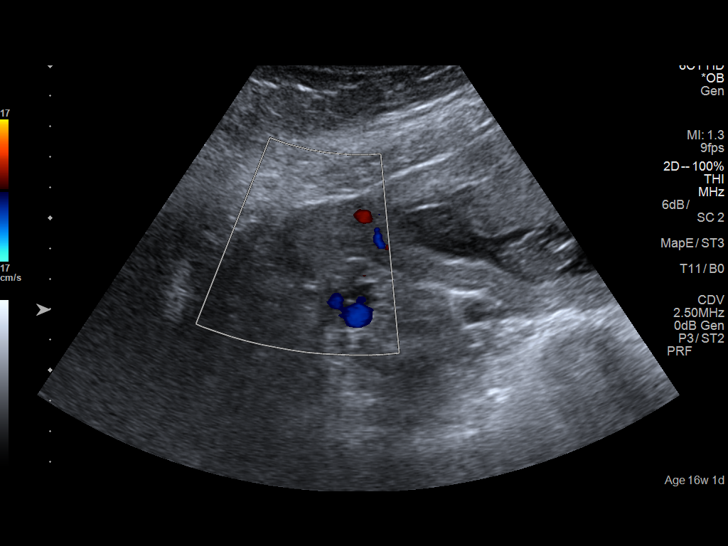
[im 27/38]
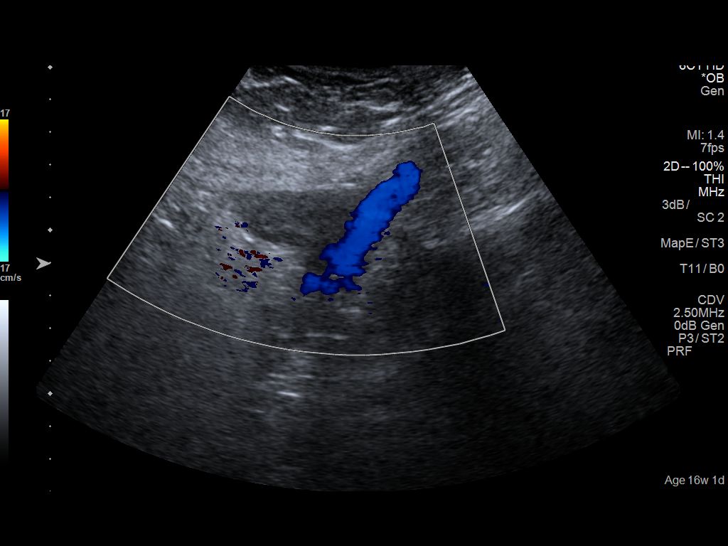
[im 29/38]
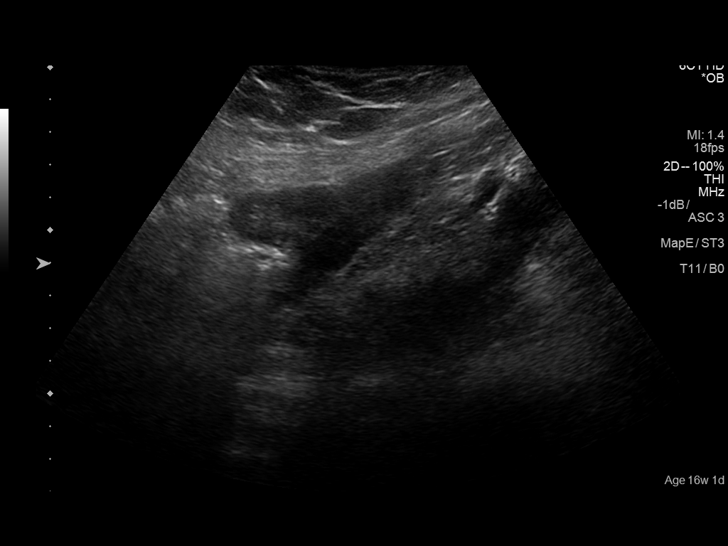
[im 32/38]
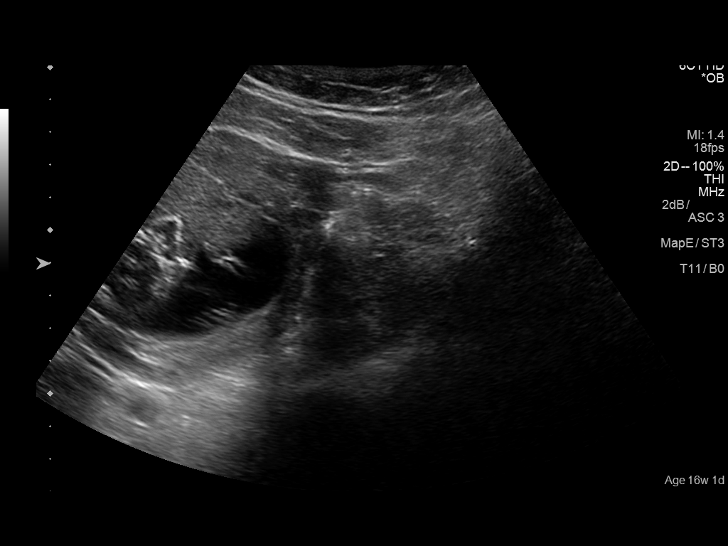
[im 35/38]
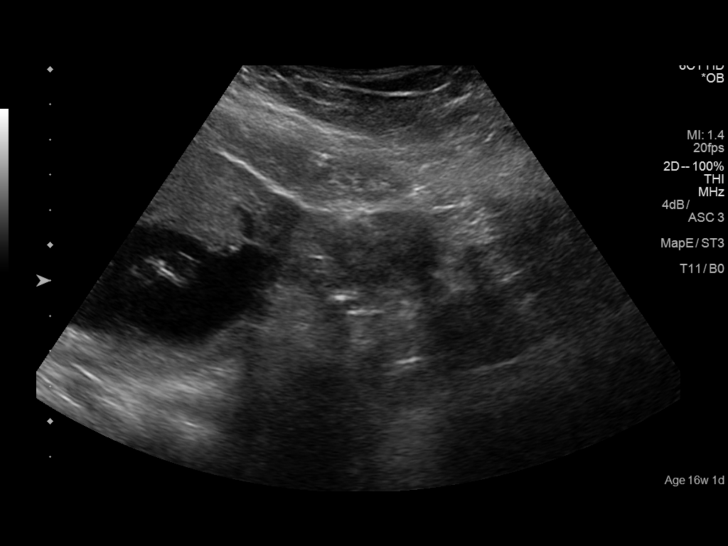
[im 38/38]
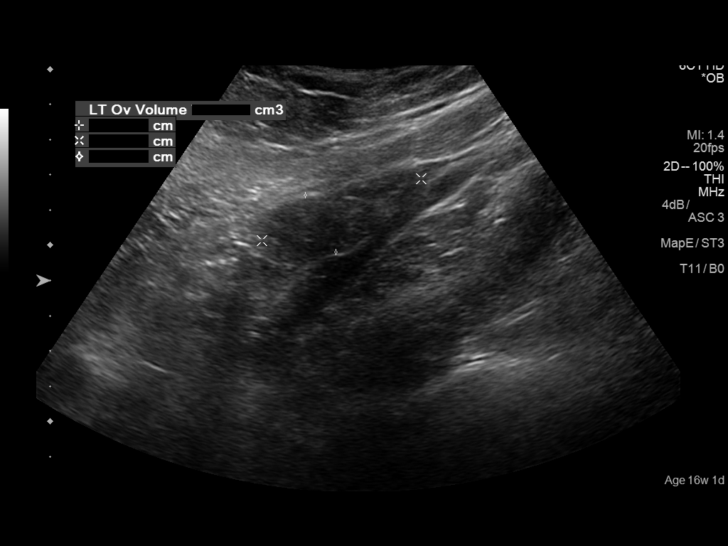

[14 of 28 positions shown; findings below may reference images not displayed]

FINDINGS: Number of Fetuses:  1

Heart Rate:  115 bpm

Movement:  Present

Presentation: Cephalic

Placental Location: Anterior

Previa: No

Amniotic Fluid (Subjective):  Within normal limits.

BPD:  3.3cm 16w  2d

MATERNAL FINDINGS:

Cervix:  Appears closed.

Uterus/Adnexae:  No abnormality visualized.
IMPRESSION: Viable 16 week 2 day intrauterine gestation. No findings for the
patient's pain.

This exam is performed on an emergent basis and does not
comprehensively evaluate fetal size, dating, or anatomy; follow-up
complete OB US should be considered if further fetal assessment is
warranted.

## 2018-03-31 IMAGING — US US ABDOMEN LIMITED
1 series · 14 of 25 positions shown · non-contrast
Comparison: None.

CLINICAL DATA: 30-year-old female who for with generalized
abdominal pain radiating to the back.

EXAM:
US ABDOMEN LIMITED - RIGHT UPPER QUADRANT

[Series 1: us abdomen limited · 0.24mm/px · 14 of 41 slices shown]
[im 1/41]
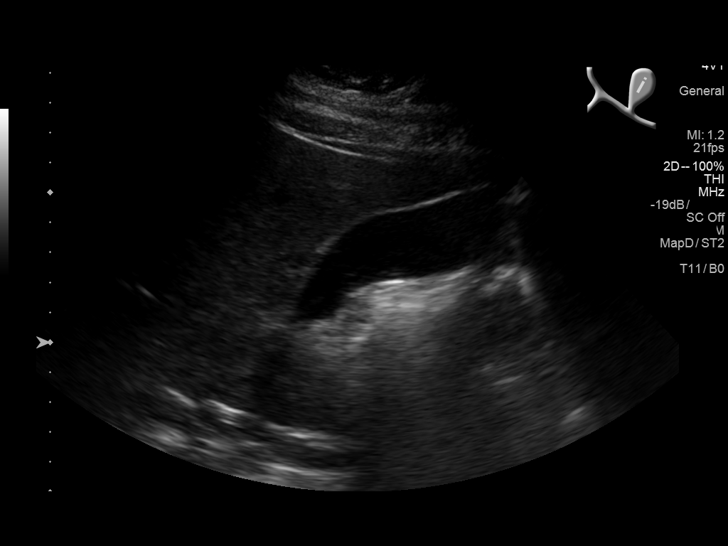
[im 4/41]
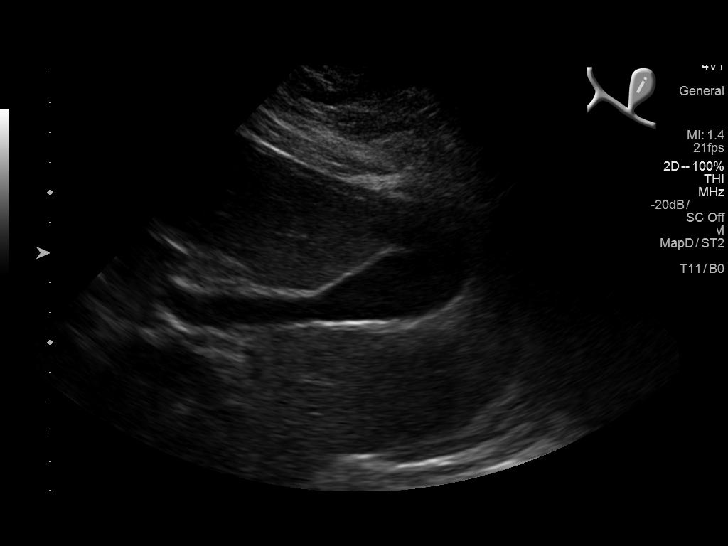
[im 7/41]
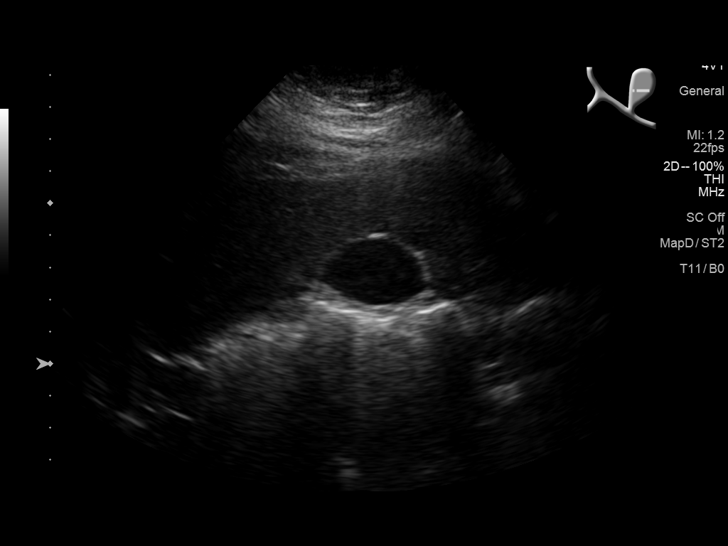
[im 11/41]
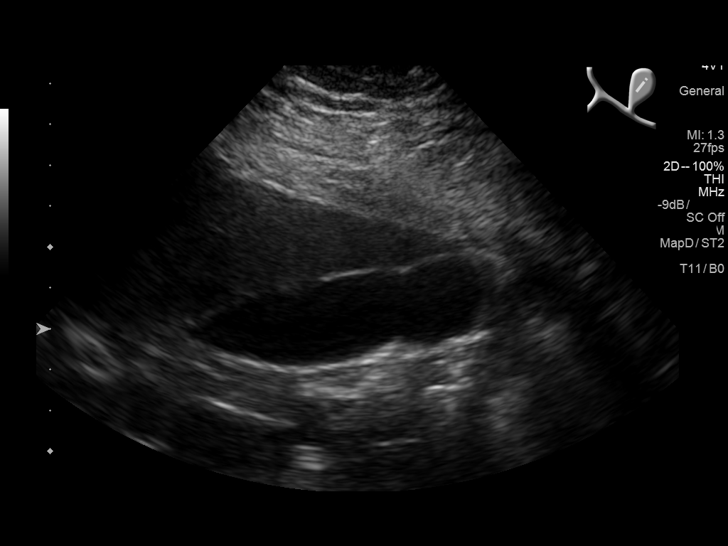
[im 14/41]
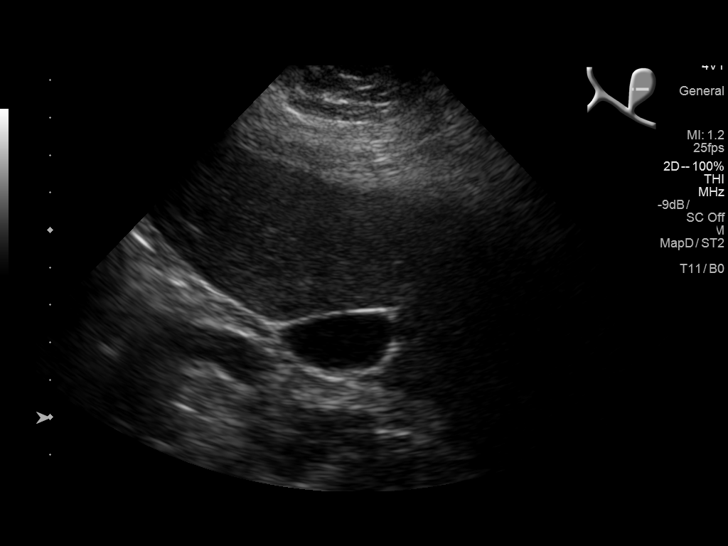
[im 16/41]
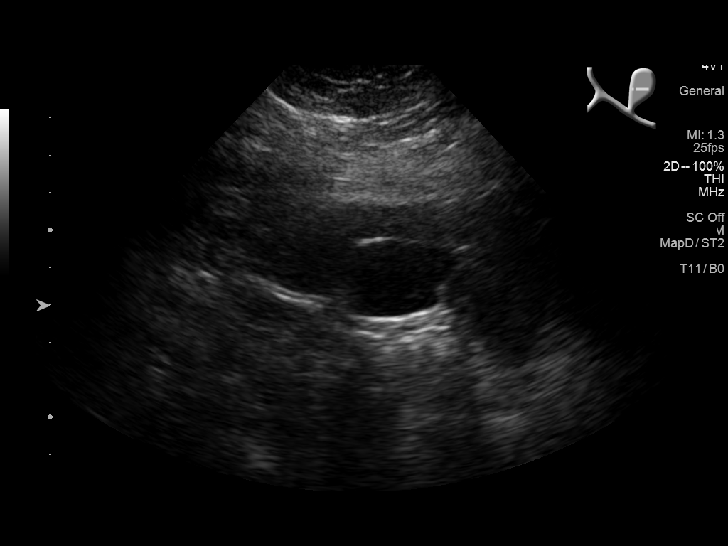
[im 19/41]
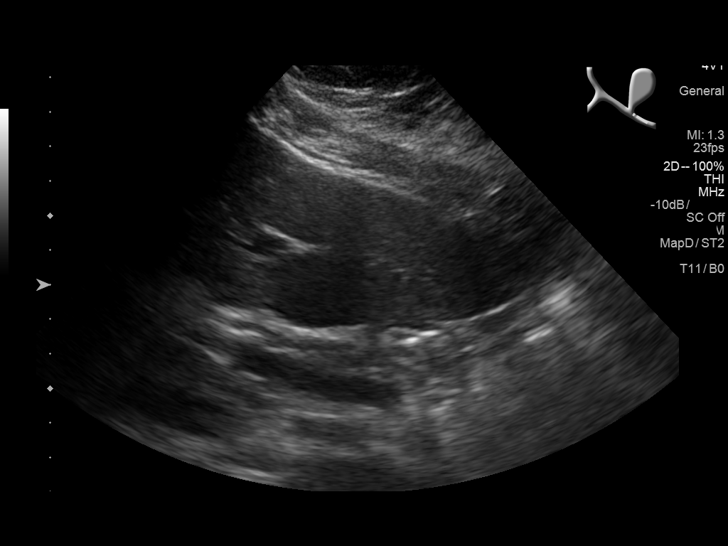
[im 22/41]
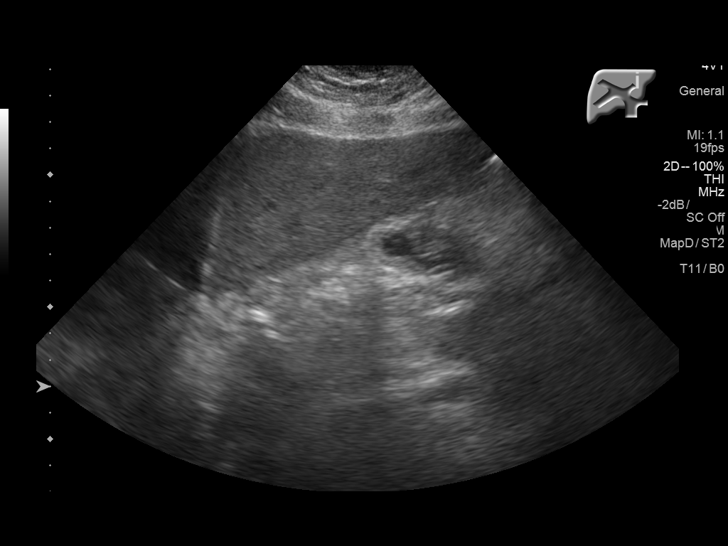
[im 26/41]
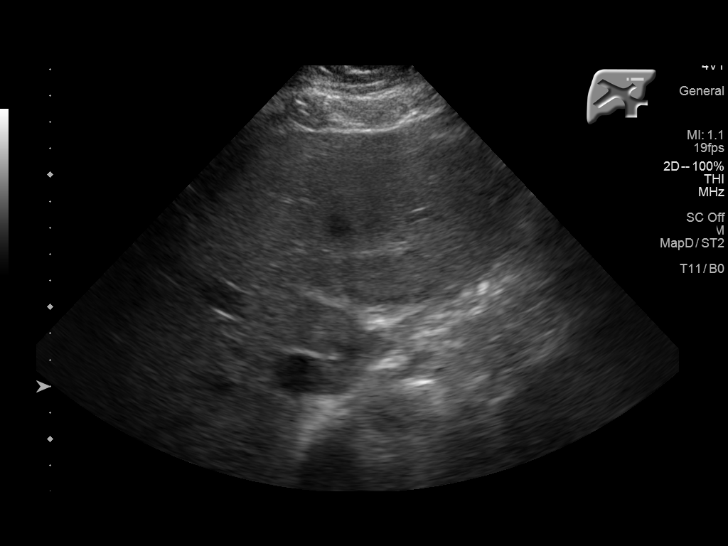
[im 27/41]
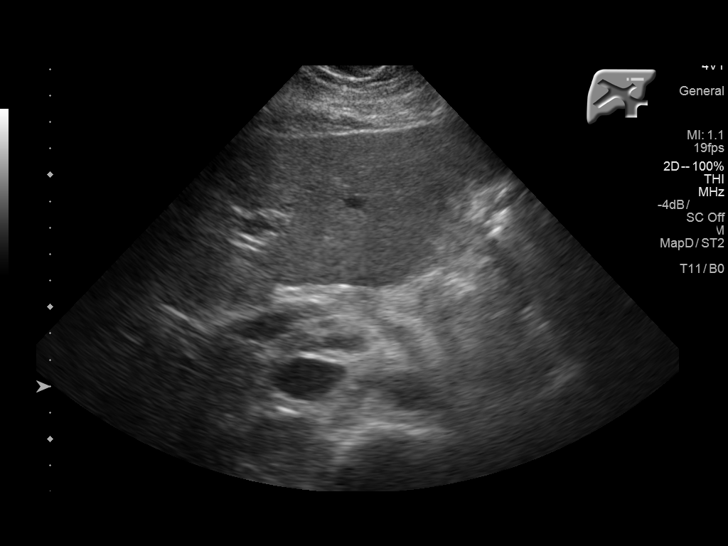
[im 31/41]
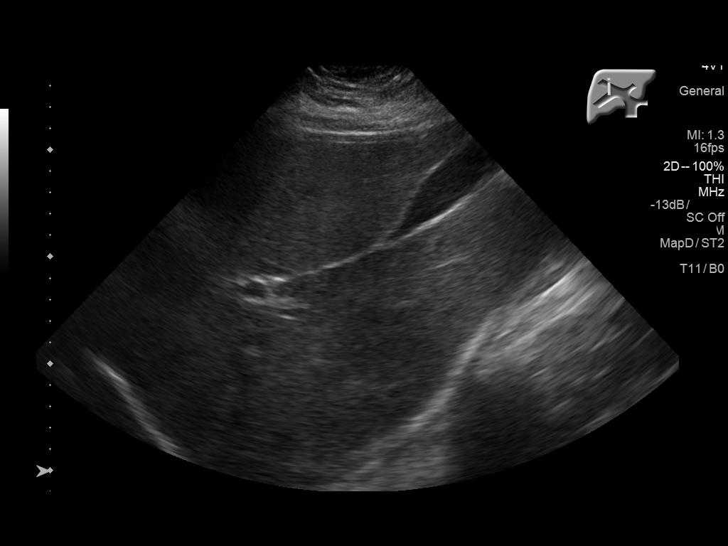
[im 34/41]
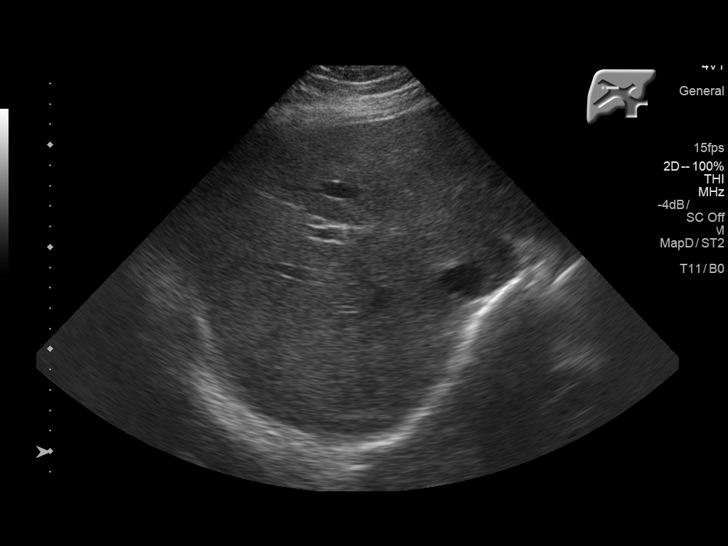
[im 37/41]
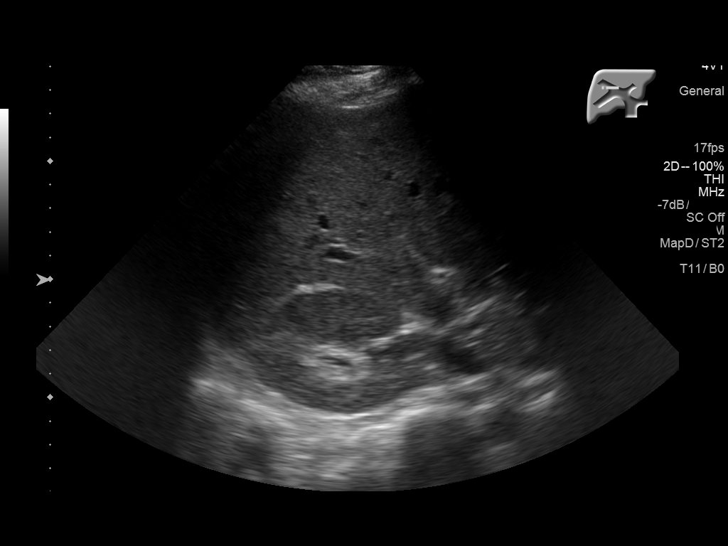
[im 41/41]
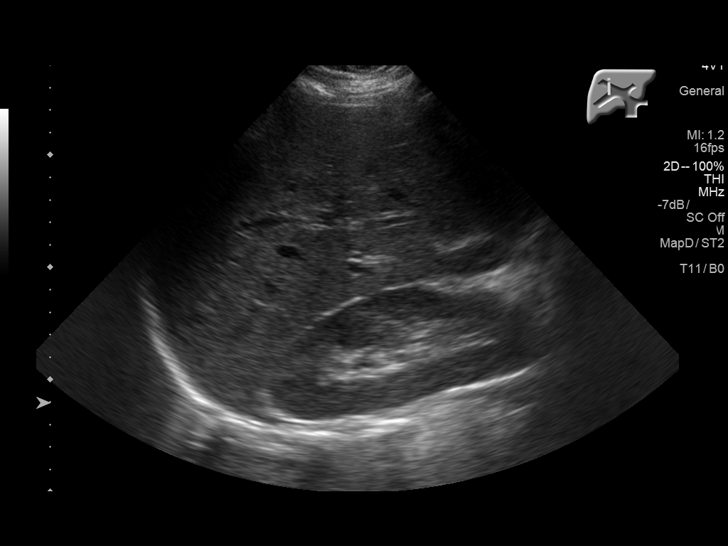

[14 of 25 positions shown; findings below may reference images not displayed]

FINDINGS: Gallbladder:

No gallstones or wall thickening visualized. No sonographic Murphy
sign noted by sonographer.

Common bile duct:

Diameter: 3 mm

Liver:

No focal lesion identified. Within normal limits in parenchymal
echogenicity.
IMPRESSION: Unremarkable right upper quadrant ultrasound.

## 2018-05-04 DIAGNOSIS — N760 Acute vaginitis: Secondary | ICD-10-CM | POA: Diagnosis not present

## 2018-05-04 DIAGNOSIS — Z113 Encounter for screening for infections with a predominantly sexual mode of transmission: Secondary | ICD-10-CM | POA: Diagnosis not present

## 2018-05-04 DIAGNOSIS — N898 Other specified noninflammatory disorders of vagina: Secondary | ICD-10-CM | POA: Diagnosis not present

## 2018-05-19 DIAGNOSIS — O26891 Other specified pregnancy related conditions, first trimester: Secondary | ICD-10-CM | POA: Diagnosis not present

## 2018-05-19 DIAGNOSIS — Z3A01 Less than 8 weeks gestation of pregnancy: Secondary | ICD-10-CM | POA: Diagnosis not present

## 2018-05-19 DIAGNOSIS — R0789 Other chest pain: Secondary | ICD-10-CM | POA: Diagnosis not present

## 2018-05-19 DIAGNOSIS — O9989 Other specified diseases and conditions complicating pregnancy, childbirth and the puerperium: Secondary | ICD-10-CM | POA: Diagnosis not present

## 2018-05-19 DIAGNOSIS — R0781 Pleurodynia: Secondary | ICD-10-CM | POA: Diagnosis not present

## 2018-05-19 DIAGNOSIS — R109 Unspecified abdominal pain: Secondary | ICD-10-CM | POA: Diagnosis not present

## 2018-05-19 DIAGNOSIS — O99211 Obesity complicating pregnancy, first trimester: Secondary | ICD-10-CM | POA: Diagnosis not present

## 2018-05-30 DIAGNOSIS — Z3A08 8 weeks gestation of pregnancy: Secondary | ICD-10-CM | POA: Diagnosis not present

## 2018-05-30 DIAGNOSIS — O09211 Supervision of pregnancy with history of pre-term labor, first trimester: Secondary | ICD-10-CM | POA: Diagnosis not present

## 2018-05-30 DIAGNOSIS — O3411 Maternal care for benign tumor of corpus uteri, first trimester: Secondary | ICD-10-CM | POA: Diagnosis not present

## 2018-06-01 DIAGNOSIS — O34219 Maternal care for unspecified type scar from previous cesarean delivery: Secondary | ICD-10-CM | POA: Diagnosis not present

## 2018-06-01 DIAGNOSIS — O09213 Supervision of pregnancy with history of pre-term labor, third trimester: Secondary | ICD-10-CM | POA: Diagnosis not present

## 2018-06-01 DIAGNOSIS — O99210 Obesity complicating pregnancy, unspecified trimester: Secondary | ICD-10-CM | POA: Diagnosis not present

## 2018-06-01 DIAGNOSIS — Z3009 Encounter for other general counseling and advice on contraception: Secondary | ICD-10-CM | POA: Diagnosis not present

## 2018-06-01 DIAGNOSIS — O09219 Supervision of pregnancy with history of pre-term labor, unspecified trimester: Secondary | ICD-10-CM | POA: Diagnosis not present

## 2018-06-29 DIAGNOSIS — O09891 Supervision of other high risk pregnancies, first trimester: Secondary | ICD-10-CM | POA: Diagnosis not present

## 2018-07-03 DIAGNOSIS — Z3A13 13 weeks gestation of pregnancy: Secondary | ICD-10-CM | POA: Diagnosis not present

## 2018-07-03 DIAGNOSIS — R51 Headache: Secondary | ICD-10-CM | POA: Diagnosis not present

## 2018-07-03 DIAGNOSIS — O9989 Other specified diseases and conditions complicating pregnancy, childbirth and the puerperium: Secondary | ICD-10-CM | POA: Diagnosis not present

## 2018-07-27 DIAGNOSIS — O09219 Supervision of pregnancy with history of pre-term labor, unspecified trimester: Secondary | ICD-10-CM | POA: Diagnosis not present

## 2018-07-27 DIAGNOSIS — O99210 Obesity complicating pregnancy, unspecified trimester: Secondary | ICD-10-CM | POA: Diagnosis not present

## 2018-07-27 DIAGNOSIS — O09213 Supervision of pregnancy with history of pre-term labor, third trimester: Secondary | ICD-10-CM | POA: Diagnosis not present

## 2018-08-01 DIAGNOSIS — Z006 Encounter for examination for normal comparison and control in clinical research program: Secondary | ICD-10-CM | POA: Diagnosis not present

## 2018-08-01 DIAGNOSIS — O219 Vomiting of pregnancy, unspecified: Secondary | ICD-10-CM | POA: Diagnosis not present

## 2018-08-01 DIAGNOSIS — Z3A00 Weeks of gestation of pregnancy not specified: Secondary | ICD-10-CM | POA: Diagnosis not present

## 2018-08-01 DIAGNOSIS — Z20828 Contact with and (suspected) exposure to other viral communicable diseases: Secondary | ICD-10-CM | POA: Diagnosis not present

## 2018-08-02 DIAGNOSIS — O09213 Supervision of pregnancy with history of pre-term labor, third trimester: Secondary | ICD-10-CM | POA: Diagnosis not present

## 2018-08-05 DIAGNOSIS — R05 Cough: Secondary | ICD-10-CM | POA: Diagnosis not present

## 2018-08-05 DIAGNOSIS — O26812 Pregnancy related exhaustion and fatigue, second trimester: Secondary | ICD-10-CM | POA: Diagnosis not present

## 2018-08-05 DIAGNOSIS — O99512 Diseases of the respiratory system complicating pregnancy, second trimester: Secondary | ICD-10-CM | POA: Diagnosis not present

## 2018-08-05 DIAGNOSIS — Z20828 Contact with and (suspected) exposure to other viral communicable diseases: Secondary | ICD-10-CM | POA: Diagnosis not present

## 2018-08-05 DIAGNOSIS — O9989 Other specified diseases and conditions complicating pregnancy, childbirth and the puerperium: Secondary | ICD-10-CM | POA: Diagnosis not present

## 2018-08-05 DIAGNOSIS — J029 Acute pharyngitis, unspecified: Secondary | ICD-10-CM | POA: Diagnosis not present

## 2018-08-05 DIAGNOSIS — R0989 Other specified symptoms and signs involving the circulatory and respiratory systems: Secondary | ICD-10-CM | POA: Diagnosis not present

## 2018-08-05 DIAGNOSIS — O99212 Obesity complicating pregnancy, second trimester: Secondary | ICD-10-CM | POA: Diagnosis not present

## 2018-08-08 DIAGNOSIS — Z3A00 Weeks of gestation of pregnancy not specified: Secondary | ICD-10-CM | POA: Diagnosis not present

## 2018-08-08 DIAGNOSIS — R1030 Lower abdominal pain, unspecified: Secondary | ICD-10-CM | POA: Diagnosis not present

## 2018-08-08 DIAGNOSIS — R42 Dizziness and giddiness: Secondary | ICD-10-CM | POA: Diagnosis not present

## 2018-08-08 DIAGNOSIS — O0992 Supervision of high risk pregnancy, unspecified, second trimester: Secondary | ICD-10-CM | POA: Diagnosis not present

## 2018-08-08 DIAGNOSIS — R109 Unspecified abdominal pain: Secondary | ICD-10-CM | POA: Diagnosis not present

## 2018-08-08 DIAGNOSIS — M545 Low back pain: Secondary | ICD-10-CM | POA: Diagnosis not present

## 2018-08-08 DIAGNOSIS — O09292 Supervision of pregnancy with other poor reproductive or obstetric history, second trimester: Secondary | ICD-10-CM | POA: Diagnosis not present

## 2018-08-08 DIAGNOSIS — O9989 Other specified diseases and conditions complicating pregnancy, childbirth and the puerperium: Secondary | ICD-10-CM | POA: Diagnosis not present

## 2018-08-08 DIAGNOSIS — O26899 Other specified pregnancy related conditions, unspecified trimester: Secondary | ICD-10-CM | POA: Diagnosis not present

## 2018-08-08 DIAGNOSIS — Z3A18 18 weeks gestation of pregnancy: Secondary | ICD-10-CM | POA: Diagnosis not present

## 2018-08-08 DIAGNOSIS — Z8759 Personal history of other complications of pregnancy, childbirth and the puerperium: Secondary | ICD-10-CM | POA: Diagnosis not present

## 2018-08-08 DIAGNOSIS — R1032 Left lower quadrant pain: Secondary | ICD-10-CM | POA: Diagnosis not present

## 2018-08-08 DIAGNOSIS — O34219 Maternal care for unspecified type scar from previous cesarean delivery: Secondary | ICD-10-CM | POA: Diagnosis not present

## 2018-08-10 DIAGNOSIS — N3000 Acute cystitis without hematuria: Secondary | ICD-10-CM | POA: Diagnosis not present

## 2018-08-10 DIAGNOSIS — O09219 Supervision of pregnancy with history of pre-term labor, unspecified trimester: Secondary | ICD-10-CM | POA: Diagnosis not present

## 2018-08-18 DIAGNOSIS — O09213 Supervision of pregnancy with history of pre-term labor, third trimester: Secondary | ICD-10-CM | POA: Diagnosis not present

## 2018-08-18 DIAGNOSIS — O021 Missed abortion: Secondary | ICD-10-CM | POA: Diagnosis not present

## 2018-08-18 DIAGNOSIS — O09219 Supervision of pregnancy with history of pre-term labor, unspecified trimester: Secondary | ICD-10-CM | POA: Diagnosis not present

## 2018-08-18 DIAGNOSIS — O26879 Cervical shortening, unspecified trimester: Secondary | ICD-10-CM | POA: Diagnosis not present

## 2022-03-27 DIAGNOSIS — B370 Candidal stomatitis: Secondary | ICD-10-CM | POA: Diagnosis not present

## 2022-03-27 DIAGNOSIS — B372 Candidiasis of skin and nail: Secondary | ICD-10-CM | POA: Diagnosis not present
